# Patient Record
Sex: Female | Born: 1935
Health system: Southern US, Community
[De-identification: ages and names within clinical notes are randomized; demographics above are authoritative.]

## PROBLEM LIST (undated history)

## (undated) DIAGNOSIS — G931 Anoxic brain damage, not elsewhere classified: Secondary | ICD-10-CM

## (undated) DIAGNOSIS — I639 Cerebral infarction, unspecified: Secondary | ICD-10-CM

## (undated) DIAGNOSIS — I10 Essential (primary) hypertension: Secondary | ICD-10-CM

## (undated) DIAGNOSIS — R569 Unspecified convulsions: Secondary | ICD-10-CM

## (undated) HISTORY — PX: NO PAST SURGERIES: SHX2092

---

## 2006-02-25 ENCOUNTER — Inpatient Hospital Stay: Payer: Self-pay | Admitting: Internal Medicine

## 2006-02-25 ENCOUNTER — Other Ambulatory Visit: Payer: Self-pay

## 2011-04-30 ENCOUNTER — Emergency Department: Payer: Self-pay | Admitting: *Deleted

## 2013-04-21 ENCOUNTER — Emergency Department: Payer: Self-pay | Admitting: Emergency Medicine

## 2013-09-23 ENCOUNTER — Inpatient Hospital Stay: Payer: Self-pay | Admitting: Specialist

## 2013-09-23 LAB — COMPREHENSIVE METABOLIC PANEL
Albumin: 3.6 g/dL (ref 3.4–5.0)
Alkaline Phosphatase: 89 U/L
Anion Gap: 5 — ABNORMAL LOW (ref 7–16)
Calcium, Total: 9.2 mg/dL (ref 8.5–10.1)
Co2: 28 mmol/L (ref 21–32)
Creatinine: 1.14 mg/dL (ref 0.60–1.30)
EGFR (African American): 52 — ABNORMAL LOW
EGFR (Non-African Amer.): 44 — ABNORMAL LOW
Osmolality: 274 (ref 275–301)
Potassium: 4 mmol/L (ref 3.5–5.1)
Sodium: 136 mmol/L (ref 136–145)

## 2013-09-23 LAB — CK-MB
CK-MB: 13.6 ng/mL — ABNORMAL HIGH (ref 0.5–3.6)
CK-MB: 12.5 ng/mL — ABNORMAL HIGH (ref 0.5–3.6)

## 2013-09-23 LAB — CBC
HCT: 45.6 % (ref 35.0–47.0)
HGB: 15 g/dL (ref 12.0–16.0)
MCH: 28.5 pg (ref 26.0–34.0)
MCV: 87 fL (ref 80–100)
Platelet: 137 10*3/uL — ABNORMAL LOW (ref 150–440)
RBC: 5.27 10*6/uL — ABNORMAL HIGH (ref 3.80–5.20)
RDW: 14 % (ref 11.5–14.5)
WBC: 8.4 10*3/uL (ref 3.6–11.0)

## 2013-09-23 LAB — URINALYSIS, COMPLETE
Bacteria: NONE SEEN
Bilirubin,UR: NEGATIVE
Glucose,UR: NEGATIVE mg/dL (ref 0–75)
Leukocyte Esterase: NEGATIVE
Nitrite: NEGATIVE
Ph: 5 (ref 4.5–8.0)
Protein: 30
RBC,UR: 1 /HPF (ref 0–5)
Specific Gravity: 1.015 (ref 1.003–1.030)

## 2013-09-23 LAB — CK TOTAL AND CKMB (NOT AT ARMC)
CK, Total: 1626 U/L — ABNORMAL HIGH (ref 21–215)
CK-MB: 18.9 ng/mL — ABNORMAL HIGH (ref 0.5–3.6)

## 2013-09-23 LAB — TSH: Thyroid Stimulating Horm: 1.56 u[IU]/mL

## 2013-09-23 LAB — TROPONIN I: Troponin-I: 0.05 ng/mL

## 2013-09-23 LAB — AMMONIA: Ammonia, Plasma: 45 mcmol/L — ABNORMAL HIGH (ref 11–32)

## 2013-09-24 LAB — CBC WITH DIFFERENTIAL/PLATELET
Basophil #: 0 10*3/uL (ref 0.0–0.1)
Basophil %: 0.9 %
Eosinophil #: 0.2 10*3/uL (ref 0.0–0.7)
Eosinophil %: 3.4 %
HGB: 11.4 g/dL — ABNORMAL LOW (ref 12.0–16.0)
Lymphocyte %: 37.1 %
MCH: 27.3 pg (ref 26.0–34.0)
MCV: 86 fL (ref 80–100)
Monocyte #: 0.6 x10 3/mm (ref 0.2–0.9)
Platelet: 125 10*3/uL — ABNORMAL LOW (ref 150–440)
RBC: 4.18 10*6/uL (ref 3.80–5.20)
WBC: 4.4 10*3/uL (ref 3.6–11.0)

## 2013-09-24 LAB — BASIC METABOLIC PANEL
BUN: 8 mg/dL (ref 7–18)
Calcium, Total: 8.7 mg/dL (ref 8.5–10.1)
Chloride: 110 mmol/L — ABNORMAL HIGH (ref 98–107)
Creatinine: 0.99 mg/dL (ref 0.60–1.30)
EGFR (African American): >60
EGFR (Non-African Amer.): 53 — ABNORMAL LOW
Glucose: 84 mg/dL (ref 65–99)
Osmolality: 281 (ref 275–301)
Potassium: 3.9 mmol/L (ref 3.5–5.1)
Sodium: 142 mmol/L (ref 136–145)

## 2013-09-24 LAB — CK: CK, Total: 1160 U/L — ABNORMAL HIGH (ref 21–215)

## 2013-09-24 LAB — CK-MB: CK-MB: 8.6 ng/mL — ABNORMAL HIGH (ref 0.5–3.6)

## 2013-10-01 ENCOUNTER — Emergency Department: Payer: Self-pay | Admitting: Emergency Medicine

## 2013-10-01 LAB — COMPREHENSIVE METABOLIC PANEL
Alkaline Phosphatase: 93 U/L
Anion Gap: 3 — ABNORMAL LOW (ref 7–16)
Bilirubin,Total: 0.5 mg/dL (ref 0.2–1.0)
Chloride: 105 mmol/L (ref 98–107)
Co2: 25 mmol/L (ref 21–32)
Creatinine: 0.68 mg/dL (ref 0.60–1.30)
EGFR (African American): >60
Osmolality: 267 (ref 275–301)
Potassium: 5.8 mmol/L — ABNORMAL HIGH (ref 3.5–5.1)
SGOT(AST): 58 U/L — ABNORMAL HIGH (ref 15–37)
SGPT (ALT): 39 U/L (ref 12–78)
Total Protein: 7.6 g/dL (ref 6.4–8.2)

## 2013-10-01 LAB — BASIC METABOLIC PANEL
Anion Gap: 4 — ABNORMAL LOW (ref 7–16)
BUN: 9 mg/dL (ref 7–18)
Calcium, Total: 9.3 mg/dL (ref 8.5–10.1)
Creatinine: 0.74 mg/dL (ref 0.60–1.30)
Glucose: 116 mg/dL — ABNORMAL HIGH (ref 65–99)
Osmolality: 270 (ref 275–301)
Sodium: 135 mmol/L — ABNORMAL LOW (ref 136–145)

## 2013-10-01 LAB — URINALYSIS, COMPLETE
Bacteria: NONE SEEN
Bilirubin,UR: NEGATIVE
Glucose,UR: NEGATIVE mg/dL (ref 0–75)
Ketone: NEGATIVE
Leukocyte Esterase: NEGATIVE
RBC,UR: NONE SEEN /HPF (ref 0–5)
WBC UR: <1 /HPF (ref 0–5)

## 2013-10-01 LAB — CBC WITH DIFFERENTIAL/PLATELET
Basophil #: 0 10*3/uL (ref 0.0–0.1)
Basophil %: 0.8 %
Eosinophil %: 1.4 %
Lymphocyte %: 24.8 %
MCHC: 32.8 g/dL (ref 32.0–36.0)
MCV: 87 fL (ref 80–100)
Monocyte #: 0.7 x10 3/mm (ref 0.2–0.9)
Platelet: 180 10*3/uL (ref 150–440)
RBC: 4.51 10*6/uL (ref 3.80–5.20)
RDW: 13.9 % (ref 11.5–14.5)
WBC: 5.5 10*3/uL (ref 3.6–11.0)

## 2013-10-01 LAB — CK TOTAL AND CKMB (NOT AT ARMC)
CK, Total: 495 U/L — ABNORMAL HIGH (ref 21–215)
CK-MB: 2.5 ng/mL (ref 0.5–3.6)

## 2014-03-25 ENCOUNTER — Emergency Department: Payer: Self-pay | Admitting: Emergency Medicine

## 2014-03-25 LAB — COMPREHENSIVE METABOLIC PANEL
ALT: 28 U/L (ref 12–78)
Albumin: 3.7 g/dL (ref 3.4–5.0)
Alkaline Phosphatase: 88 U/L
Anion Gap: 0 — ABNORMAL LOW (ref 7–16)
BUN: 16 mg/dL (ref 7–18)
Bilirubin,Total: 1 mg/dL (ref 0.2–1.0)
CALCIUM: 9 mg/dL (ref 8.5–10.1)
CO2: 32 mmol/L (ref 21–32)
Chloride: 102 mmol/L (ref 98–107)
Creatinine: 0.77 mg/dL (ref 0.60–1.30)
EGFR (Non-African Amer.): >60
Glucose: 104 mg/dL — ABNORMAL HIGH (ref 65–99)
OSMOLALITY: 270 (ref 275–301)
POTASSIUM: 4.2 mmol/L (ref 3.5–5.1)
SGOT(AST): 22 U/L (ref 15–37)
Sodium: 134 mmol/L — ABNORMAL LOW (ref 136–145)
Total Protein: 8.1 g/dL (ref 6.4–8.2)

## 2014-03-25 LAB — CBC
HCT: 42.6 % (ref 35.0–47.0)
HGB: 13.5 g/dL (ref 12.0–16.0)
MCH: 27.6 pg (ref 26.0–34.0)
MCHC: 31.6 g/dL — ABNORMAL LOW (ref 32.0–36.0)
MCV: 87 fL (ref 80–100)
PLATELETS: 136 10*3/uL — AB (ref 150–440)
RBC: 4.87 10*6/uL (ref 3.80–5.20)
RDW: 14.2 % (ref 11.5–14.5)
WBC: 4.8 10*3/uL (ref 3.6–11.0)

## 2014-03-25 LAB — URINALYSIS, COMPLETE
Bilirubin,UR: NEGATIVE
Blood: NEGATIVE
GLUCOSE, UR: NEGATIVE mg/dL (ref 0–75)
KETONE: NEGATIVE
Leukocyte Esterase: NEGATIVE
Nitrite: NEGATIVE
PH: 7 (ref 4.5–8.0)
Protein: NEGATIVE
Specific Gravity: 1.01 (ref 1.003–1.030)
Squamous Epithelial: 1
WBC UR: NONE SEEN /HPF (ref 0–5)

## 2014-03-25 LAB — CK TOTAL AND CKMB (NOT AT ARMC)
CK, TOTAL: 568 U/L — AB
CK-MB: 3.2 ng/mL (ref 0.5–3.6)

## 2014-03-25 LAB — TROPONIN I: Troponin-I: <0.02 ng/mL

## 2014-04-16 ENCOUNTER — Emergency Department: Payer: Self-pay | Admitting: Emergency Medicine

## 2014-04-16 LAB — CBC
HCT: 39.2 % (ref 35.0–47.0)
HGB: 12.6 g/dL (ref 12.0–16.0)
MCH: 28.2 pg (ref 26.0–34.0)
MCHC: 32.2 g/dL (ref 32.0–36.0)
MCV: 88 fL (ref 80–100)
Platelet: 148 10*3/uL — ABNORMAL LOW (ref 150–440)
RBC: 4.47 10*6/uL (ref 3.80–5.20)
RDW: 13.8 % (ref 11.5–14.5)
WBC: 5.2 10*3/uL (ref 3.6–11.0)

## 2014-04-16 LAB — COMPREHENSIVE METABOLIC PANEL
ALT: 32 U/L (ref 12–78)
ANION GAP: 6 — AB (ref 7–16)
Albumin: 3.2 g/dL — ABNORMAL LOW (ref 3.4–5.0)
Alkaline Phosphatase: 88 U/L
BUN: 18 mg/dL (ref 7–18)
Bilirubin,Total: 0.5 mg/dL (ref 0.2–1.0)
CO2: 26 mmol/L (ref 21–32)
CREATININE: 1.18 mg/dL (ref 0.60–1.30)
Calcium, Total: 8.6 mg/dL (ref 8.5–10.1)
Chloride: 105 mmol/L (ref 98–107)
EGFR (African American): 49 — ABNORMAL LOW
GFR CALC NON AF AMER: 42 — AB
GLUCOSE: 99 mg/dL (ref 65–99)
Osmolality: 276 (ref 275–301)
Potassium: 4.4 mmol/L (ref 3.5–5.1)
SGOT(AST): 33 U/L (ref 15–37)
SODIUM: 137 mmol/L (ref 136–145)
Total Protein: 7.3 g/dL (ref 6.4–8.2)

## 2014-04-16 LAB — CK: CK, Total: 535 U/L — ABNORMAL HIGH

## 2015-01-27 NOTE — Consult Note (Signed)
Referring Physician:  Albertine Patricia   Primary Care Physician:   Albertine Patricia Avinger, [address], [place], [place] [address], [place] [phone]  Reason for Consult: Admit Date: 23-Sep-2013  Chief Complaint: Generalized weakness, confusion.  Reason for Consult: confusion; seizure   History of Present Illness: History of Present Illness:   81 year old woman with a rather complex medical history presents after being found down.  She fell at some point this morning and then pressed her life alert button.  She is not sure what caused the fall.  Not sure if she had a seizure she says.  "Felt weak" is her best description.  Sister at bedside reports the patient stopped taking all her medication a few weeks ago.  Says patient is generally noncompliant with her medication to start with.  Patient does not own a pill box.  She lives alone and gets most of her meals from [employer].  Given the patient has a vague history of seizures, it is unclear if her going down today may have been related to a seizure vs other cause of syncope. MEDICAL HISTORY: Anoxic brain injury. Acute respiratory failure. Dysphagia with PEG tube placement. Aspiration pneumonia.  Cardiac arrest subsequent to ventricular tachycardia and atrial flutter.  Bilateral pulmonary emboli with placement of IVC filter.  Retroperitoneal bleed secondary to heparin drip while in the hospital.  Seizure disorder.  Recurrent UTI. Hypertension.  Resolved stage II pressure ulcer on the right buttocks.  Depression.  Diet-controlled diabetes.  Azotemia. SURGICAL HISTORY:  Trach. PEG tube. HISTORY: at home by herself, gets meals from [employer] wheels.tobacco, drugs or alcohol. HISTORY:  MEDICATIONS: below. as below.     ROS:  General denies complaints   HEENT no complaints   Lungs no complaints   Cardiac no complaints   GI no complaints   GU no complaints   Musculoskeletal no complaints    Extremities no complaints   Skin no complaints   Endocrine no complaints   Psych no complaints   Past Medical/Surgical Hx:  Impaired Neurologic Function:   Anoxic Encephalopathy s/p cardiac arrest:   Seizures: Per family pt dx w/ seizures however non-compliant w/ meds  Hypertension:   Home Medications:  Medication Instructions Last Modified Date/Time  Keppra 750 mg oral tablet 1 tab(s) orally once a day 18-Dec-14 07:12  Tylenol  orally , As Needed 18-Dec-14 16:29  Keppra  orally 2 times a day 18-Dec-14 16:30  citalopram 20 mg oral tablet 1 tab(s) orally once a day 18-Dec-14 16:32  LORazepam  orally 2 times a day 18-Dec-14 16:30  aspirin  orally  18-Dec-14 16:31   [facility] Current Meds:   Sodium Chloride 0.9%, 1000 ml at 100 ml/hr, Stop After: 2 Doses  Acetaminophen * tablet, ( Tylenol (325 mg) tablet)  650 mg Oral q4h PRN for pain or temp. greater than 100.4  - Indication: Pain/Fever  Docusate Sodium capsule, ( Colace)  100 mg Oral bid PRN for constipation  - Indication: Stool Softener  HePARin injection, 5000 unit(s), Subcutaneous, q8h  Indication: Anticoagulant, Monitor Anticoags per hospital protocol  Ondansetron injection, ( Zofran injection )  4 mg, IV push, q4h PRN for Nausea/Vomiting  Indication: Nausea/ Vomiting  Senna tablet, ( Senokot)  1 tablet(s) Oral bid PRN for constipation  - Indication: Stool Softner/ Constipation/ Bowel Prep for Surgery  Instructions:  1 tablet = 8.6 mg  Lactulose 10G/78m Syrup, ( Cephulac 10G/111msyrup )  30 ml Oral bid  -Indication:Constipation/ Encephalopathy  Nursing Saline Flush, 3 to 6 ml, IV push, Q1M PRN for IV Maintenance  levETIRAcetam  tablet, 750 mg Oral q12h  - Indication: Seizures, bipolar disorder  Allergies:  No Known Allergies:   Vital Signs: **Vital Signs.:   18-Dec-14 08:24  Vital Signs Type Admission  Temperature Temperature (F) 98.5  Celsius 36.9  Temperature Source oral  Pulse Pulse 77   Respirations Respirations 20  Systolic BP Systolic BP 829  Diastolic BP (mmHg) Diastolic BP (mmHg) 80  Mean BP 100  Pulse Ox % Pulse Ox % 99  Pulse Ox Activity Level  At rest  Oxygen Delivery Room Air/ 21 %   EXAM: GENERAL: Pleasant woman, talkative.  NAD.  Normocephalic and atraumatic.  EYES: Funduscopic exam shows normal disc size, appearance and C/D ratio without clear evidence of papilledema.  CARDIOVASCULAR: S1 and S2 sounds are within normal limits, without murmurs, gallops, or rubs.  MUSCULOSKELETAL: Bulk - Normal Tone - Normal Pronator Drift - Absent bilaterally. Ambulation - Deferred due to falls risk.  R/L 5/5    Shoulder abduction (deltoid/supraspinatus, axillary/suprascapular n, C5) 5/5    Elbow flexion (biceps brachii, musculoskeletal n, C5-6) 5/5    Elbow extension (triceps, radial n, C7) 5/5    Finger adduction (interossei, ulnar n, T1)   5/5    Hip flexion (iliopsoas, L1/L2) 5/5    Knee flexion (hamstrings, sciatic n, L5/S1) 5/5    Knee extension (quadriceps, femoral n, L3/4) 5/5    Ankle dorsiflexion (tibialis anterior, deep fibular n, L4/5) 5/5    Ankle plantarflexion (gastroc, tibial n, S1)  NEUROLOGICAL: MENTAL STATUS: Patient is oriented to person, place and time.  Recent memory is mildly diminished.  Remote memory is intact.  Attention span and concentration are moderately diminished.  Naming, repetition, comprehension and expressive speech are within normal limits.  Patient's fund of knowledge is within normal limits for educational level.  CRANIAL NERVES: Normal    CN II (normal visual acuity and visual fields) Normal    CN III, IV, VI (extraocular muscles are intact) Normal    CN V (facial sensation is intact bilaterally) Normal    CN VII (facial strength is intact bilaterally) Normal    CN VIII (hearing is intact bilaterally) Normal    CN IX/X (palate elevates midline, normal phonation) Normal    CN XI (shoulder shrug strength is normal and  symmetric) Normal    CN XII (tongue protrudes midline)   SENSATION: Intact to pain and temp bilaterally (spinothalamic tracts) Intact to position and vibration bilaterally (dorsal columns)   REFLEXES: R/L 2+/2+    Biceps 2+/2+    Brachioradialis   2+/2+    Patellar 2+/2+    Achilles   COORDINATION/CEREBELLAR: Finger to nose testing is within normal limits..  Lab Results:  Thyroid:  18-Dec-14 06:06   Thyroid Stimulating Hormone 1.56 (0.45-4.50 (International Unit)  ----------------------- Pregnant patients have  different reference  ranges for TSH:  - - - - - - - - - -  Pregnant, first trimetser:  0.36 - 2.50 uIU/mL)  Hepatic:  18-Dec-14 04:24   Bilirubin, Total 1.0 (Result(s) reported on 23 Sep 2013 at 05:17AM.)  Alkaline Phosphatase 112 (45-117 NOTE: New Reference Range 08/27/13)  SGPT (ALT) 31  Total Protein, Serum  8.9    06:06   Bilirubin, Total 0.8  Alkaline Phosphatase 89 (45-117 NOTE: New Reference Range 08/27/13)  SGPT (ALT) 26  SGOT (AST)  47  Total Protein,  Serum 7.0  Albumin, Serum 3.6  Cardiology:  18-Dec-14 05:06   Ventricular Rate 75  Atrial Rate 75  P-R Interval 130  QRS Duration 82  QT 390  QTc 435  P Axis 64  R Axis 5  T Axis 16  ECG interpretation Normal sinus rhythm with sinus arrhythmia Possible Left atrial enlargement Septal infarct , age undetermined Abnormal ECG When compared with ECG of 30-Apr-2011 21:42, Vent. rate has decreased BY  81 BPM (RBBB and left posterior fascicular block) is no longer Present Septal infarct is now Present ----------unconfirmed---------- Confirmed by OVERREAD, NOT ([id number]), editor PEARSON, BARBARA ([id number]) on 09/23/2013 10:20:44 AM  Routine Chem:  18-Dec-14 04:24   Result Comment TROPONIN - RESULTS VERIFIED BY REPEAT TESTING.  - CALLED TO KIM KOECHERT AT 4287 ON  - 09/23/2013.Marland KitchenMarland KitchenTPL  - READ-BACK PROCESS PERFORMED.  Result(s) reported on 23 Sep 2013 at 05:14AM.  Glucose, Serum  104    06:06    Ammonia, Plasma  45 (Result(s) reported on 23 Sep 2013 at 06:41AM.)  Glucose, Serum  112  BUN 15  Creatinine (comp) 1.14  Sodium, Serum 136  Potassium, Serum 4.0  Chloride, Serum 103  CO2, Serum 28  Calcium (Total), Serum 9.2  Osmolality (calc) 274  eGFR (African American)  52  eGFR (Non-African American)  44 (eGFR values <20m/min/1.73 m2 may be an indication of chronic kidney disease (CKD). Calculated eGFR is useful in patients with stable renal function. The eGFR calculation will not be reliable in acutely ill patients when serum creatinine is changing rapidly. It is not useful in  patients on dialysis. The eGFR calculation may not be applicable to patients at the low and high extremes of body sizes, pregnant women, and vegetarians.)  Anion Gap  5    09:13   Result Comment TROPONIN - RESULTS VERIFIED BY REPEAT TESTING.  - READ-BACK PROCESS PERFORMED.  - GLENDA FIELDS 09/23/13 '@1002' ...MTV  Result(s) reported on 23 Sep 2013 at 10:04AM.    14:25   Result Comment TROPONIN - RESULTS VERIFIED BY REPEAT TESTING.  - READ-BACK PROCESS PERFORMED.  - ROBIN THOMAS 09/23/13 '@1513' ...MTV  Result(s) reported on 23 Sep 2013 at 03:17PM.  Cardiac:  18-Dec-14 04:24   CPK-MB, Serum  18.9 (Result(s) reported on 23 Sep 2013 at 05:14AM.)  Troponin I  0.07 (0.00-0.05 0.05 ng/mL or less: NEGATIVE  Repeat testing in 3-6 hrs  if clinically indicated. >0.05 ng/mL: POTENTIAL  MYOCARDIAL INJURY. Repeat  testing in 3-6 hrs if  clinically indicated. NOTE: An increase or decrease  of 30% or more on serial  testing suggests a  clinically important change)  CK, Total  1626    09:13   CPK-MB, Serum  13.6 (Result(s) reported on 23 Sep 2013 at 10:01AM.)  Troponin I  0.06 (0.00-0.05 0.05 ng/mL or less: NEGATIVE  Repeat testing in 3-6 hrs  if clinically indicated. >0.05 ng/mL: POTENTIAL  MYOCARDIAL INJURY. Repeat  testing in 3-6 hrs if  clinically indicated. NOTE: An increase or decrease  of 30%  or more on serial  testing suggests a  clinically important change)    14:25   CPK-MB, Serum  12.5 (Result(s) reported on 23 Sep 2013 at 03:10PM.)  Troponin I 0.05 (0.00-0.05 0.05 ng/mL or less: NEGATIVE  Repeat testing in 3-6 hrs  if clinically indicated. >0.05 ng/mL: POTENTIAL  MYOCARDIAL INJURY. Repeat  testing in 3-6 hrs if  clinically indicated. NOTE: An increase or decrease  of 30% or more on serial  testing suggests a  clinically important change)  Routine UA:  18-Dec-14 07:03   Color (UA) Yellow  Clarity (UA) Clear  Glucose (UA) Negative  Bilirubin (UA) Negative  Ketones (UA) Trace  Specific Gravity (UA) 1.015  Blood (UA) 2+  pH (UA) 5.0  Protein (UA) 30 mg/dL  Nitrite (UA) Negative  Leukocyte Esterase (UA) Negative (Result(s) reported on 23 Sep 2013 at 07:37AM.)  RBC (UA) 1 /HPF  WBC (UA) <1 /HPF  Bacteria (UA) NONE SEEN  Epithelial Cells (UA) <1 /HPF  Mucous (UA) PRESENT (Result(s) reported on 23 Sep 2013 at 07:37AM.)  Routine Hem:  18-Dec-14 04:24   WBC (CBC) 8.4  RBC (CBC)  5.27  Hemoglobin (CBC) 15.0  Hematocrit (CBC) 45.6  Platelet Count (CBC)  137 (Result(s) reported on 23 Sep 2013 at 04:50AM.)  MCV 87  MCH 28.5  MCHC 33.0  RDW 14.0   Radiology Results: CT:    18-Dec-14 03:34, CT Head Without Contrast  CT Head Without Contrast   REASON FOR EXAM:    ams  COMMENTS:       PROCEDURE: CT  - CT HEAD WITHOUT CONTRAST  - Sep 23 2013  3:34AM     CLINICAL DATA:  Altered mental status    EXAM:  CT HEAD WITHOUT CONTRAST    TECHNIQUE:  Contiguous axial images were obtained from the base of the skull  through the vertex without intravenous contrast.    COMPARISON:  Prior CT from 04/30/2011  FINDINGS:  Diffuse prominence of the CSF containing spaces is compatible with  generalized age-related atrophy. Scattered and confluent hypodensity  within the periventricular deep white matter is most consistent with  chronic small vessel ischemic changes.  Overall, these findings are  slightly progressed relative to prior CT from 2012, but stable as  compared with recent CT from 04/21/2013.    There is no acute intracranial hemorrhage or infarct. No mass lesion  or midline shift. Gray-white matter differentiation is well  maintained. Ventricles are normal in size without evidence of  hydrocephalus. No extra-axial fluid collection. Basal ganglia  calcifications noted.    The calvarium is intact.  Orbital soft tissues are within normal limits.    The paranasal sinuses and mastoid air cells are well pneumatized and  free of fluid.    Scalp soft tissues are unremarkable.     IMPRESSION:  1. No acute intracranial process.  2. Atrophy with chronic microvascular ischemic changes.      Electronically Signed    By: Jeannine Boga M.D.    On: 09/23/2013 03:50     Verified By: Neomia Glass, M.D.,   Impression/Recommendations: Recommendations:   81 year old woman with a rather complex medical history presents after being found down.   personally viewed, it is unremarkable.  Labs are unremarkable.  EKG shows NSR.  Unclear why the patient went down today.  In any case, the patient and her sister feel that she is at her baseline level both physically and cognitively.  Given ongoing concern for seizure, would rec routine either inpatient or outpatient EEG to eval for epileptiform activity.  Patient has stopped all of her medication and demonstrates poor cognitive processing today.  She would likely benefit from assisted living placement to help with her meals and medication administration.  Her memory and cognition should be evaluated in more detail on an outpatient basis.  Stroke unlikely in this case since no clear new neurologic deficits.  Rec patient take Keppra 500 mg bid or 750 mg  bid for seizure prevention given her history of seizures.   have reviewed the results of the most recent imaging studies, tests and labs as outlined  above and answered all related questions.  have personally viewed the patient's HCT and it is unremarkable. and coordinated plan of care with hospitalist.   Electronic Signatures: Anabel Bene (MD)  (Signed 19-Dec-14 00:04)  Authored: REFERRING PHYSICIAN, Primary Care Physician, Consult, History of Present Illness, Review of Systems, PAST MEDICAL/SURGICAL HISTORY, HOME MEDICATIONS, Current Medications, ALLERGIES, NURSING VITAL SIGNS, Physical Exam-, LAB RESULTS, RADIOLOGY RESULTS, Recommendations   Last Updated: 19-Dec-14 00:04 by Anabel Bene (MD)

## 2015-01-27 NOTE — H&P (Signed)
PATIENT NAME:  April Powers, TEMPLIN MR#:  [id number] DATE OF BIRTH:  21-May-1928  DATE OF ADMISSION:  09/23/2013  REFERRING PHYSICIAN: Marta Antu, MD  PRIMARY CARE PHYSICIAN: The patient reports she has not been seeing any primary care physician since she left [facility].   PRIMARY NEUROLOGIST: Jennings Books, MD   CHIEF COMPLAINT: Generalized weakness, confusion.   HISTORY OF PRESENT ILLNESS: This is an 81 year old female who lives at home by herself. The patient is known to have history of anoxic brain injury with seizure related to anoxic brain injury as well. The patient lives at home by herself. She used to live at [facility] for 1 year. The patient was living at home initially with home care, but due to financial reasons the patient is currently living by herself and she does not want any further assistance at home. The patient is an extremely poor historian, cannot give any reliable history, so most of the history was obtained from the sister at bedside. The patient is known to have cognitive impairment. The patient had basic work-up done and CT of the head. CT did not show any acute finding, only showed chronic microvascular ischemic changes. The patient's blood work was significant for elevated CK and CK-MB and mildly elevated troponins. Upon further questioning, the patient reports history of fall today. Reports she fell today at her home. She does not recall for how long she stayed on the floor, but she reports when she woke up she felt weak where she pressed her Life Alert, and she called 911 complaining of weakness. The patient is a poor historian, cannot give any further details on about the timing of these falls and how long did she stay on the floor. The patient denies any urinary incontinence, any tongue biting, denies any seizure activity when it happened, but at the same time we do not know as it was unwitnessed and there was no one else present at home. As well, the patient reports she  stopped taking all of his medication over the last 2 weeks as she thinks she does not need them. The patient reports she does not want to live in [place]. She wants to go to live with her son in [place]. Hospitalist service was requested to admit the patient for further evaluation.   PAST MEDICAL HISTORY: 1.  Anoxic brain injury. 2.  Acute respiratory failure resulting in temporary ventilator dependence with tracheostomy.  3.  Dysphagia with PEG tube placement. 4.  Aspiration pneumonia.  5.  Cardiac arrest subsequent to ventricular tachycardia and atrial flutter.  6.  Bilateral pulmonary emboli with placement of IVC filter.  7.  Retroperitoneal bleed secondary to heparin drip while in the hospital.  8.  Seizure disorder.  9.  Recurrent UTI. 10.  Hypertension.  11.  Resolved stage II pressure ulcer on the right buttocks.  12.  Depression.  13.  Diet-controlled diabetes.  14.  Azotemia.  PAST SURGICAL HISTORY:  1.  Trach. 2.  PEG tube placement.   HOME MEDICATIONS: As reported earlier, the patient does not take any home medication for the last few weeks. Did not bring her medication with her, but she was on Depakote 750 mg oral 2 times a day, but she has not been taking for a few weeks. Will request her medication list in the morning from the pharmacy. Daughter reports she goes to [facility].   SOCIAL HISTORY: The patient currently lives at home by herself. No tobacco. No alcohol. No  illicit drug use.   FAMILY HISTORY: Significant for hypertension.   REVIEW OF SYSTEMS: Even though the patient is alert x3, she is confused, very unreliable historian, cannot obtain reliable review of systems from her.  PHYSICAL EXAMINATION: VITAL SIGNS: Temperature 97.7, pulse 79, respiratory rate 26, blood pressure 131/72, saturating 100% on room air.  GENERAL: Well-nourished female who lies comfortable in bed, in no apparent distress.  HEENT: Head atraumatic, normocephalic. Pupils equal  and reactive to light. Pink conjunctivae. Anicteric sclerae. Dry oral mucosa.  NECK: Supple. No thyromegaly. No JVD.  CHEST: Good air entry bilaterally. No wheezing, rales, or rhonchi.  CARDIOVASCULAR: S1, S2 heard. No rubs, murmurs, or gallops.  ABDOMEN: Soft, nontender, and nondistended. Bowel sounds present.  EXTREMITIES: No edema. No clubbing. No cyanosis. Pedal pulses felt bilaterally.  PSYCHIATRIC: The patient is awake and alert x3, knows the month, knows the year.  It took her some time to figure the year. She is confused, has significantly impaired judgment, argumentative. NEUROLOGIC: Cranial nerves appear to be grossly intact. Motor: Appears to be moving all extremities without significant deficits, 5/5. MUSCULOSKELETAL: No joint erythema or effusion. Has significant arthritic changes in her fingers.   PERTINENT LABORATORY DATA: Glucose 112, BUN 15, creatinine 1.14, sodium 136, potassium 4, chloride 103, CO2 28, total protein 7, ALT 26, AST 47, alk phos 89, ammonia 45. CK total 1626, CK-MB 18.9, troponin 0.07. White blood cell 8.4, hemoglobin 15, hematocrit 45.6, platelets 137.   IMAGING STUDIES: CT head without contrast showing no acute intracranial process and atrophy with chronic microvascular ischemic changes.   Chest PA and lateral showing emphysema. No acute cardiopulmonary abnormality identified.   ASSESSMENT AND PLAN: 1.  Rhabdomyolysis. The patient appears to be in rhabdomyolysis. This is most likely as she has been on the floor for unknown amount of time so she will be started on IV fluids, 100 mL per hour, and will recheck CK total and CK-MB in a.m.  2.  Confusion, altered mental status. The patient appeared to be having baseline cognitive impairment. As per her sister, this is not much different from her baseline, but unclear if the initial episode was due to syncope or seizure episode, so the patient will be loaded with IV Keppra as the patient has not been taking for the last  2 weeks and will be resumed on Keppra. Will consult neurology, Dr. Jennings Books. As well, unclear if she had syncope or not so she will be monitored on off unit tele monitor as well. We will check TSH. We will check urinalysis. As well, her ammonia level is mildly elevated so we will start her on low-dose lactulose.  3.  Medication noncompliance. The patient was counseled at length and she is agreeable to resume back her Keppra.  4.  History of seizures. We will resume Keppra, start a loading dose. We will also consult Dr. Jennings Books.  5.  Deep vein thrombosis prophylaxis. Subcutaneous heparin.   CODE STATUS: The patient reports she is a FULL code. We will get social work consult as the patient appears to be needing more assistance at home and she might need placement.   TOTAL TIME SPENT ON ADMISSION AND PATIENT CARE: 55 minutes.   ____________________________ Albertine Patricia, MD dse:sb D: 09/23/2013 07:02:30 ET T: 09/23/2013 07:27:13 ET JOB#: [id number]  cc: Albertine Patricia, MD, <Dictator> Isamar Wellbrock Graciela Husbands MD ELECTRONICALLY SIGNED 09/24/2013 6:51

## 2015-01-28 NOTE — Discharge Summary (Signed)
PATIENT NAME:  April Powers, April MR#:  [id number] DATE OF BIRTH:  04/01/1928  DATE OF ADMISSION:  09/23/2013 DATE OF DISCHARGE:  09/25/2013  HISTORY OF PRESENT ILLNESS: For a detailed note, please take a look at the history and physical done on admission by Dr.  Randol KernElgergawy.    DIAGNOSES AT DISCHARGE: As follows: 1. Altered mental status secondary to seizures.  2. Seizures secondary to medical noncompliance.   DIET: The patient is being discharged on a regular diet.   ACTIVITY: As tolerated.   FOLLOWUP[place]   DISCHARGE MEDICATIONS: Keppra 750 mg b.i.d.   CONSULTANTS DURING THE HOSPITAL COURSE: Dr. Theora MasterZachary Potter from neurology.   PERTINENT STUDIES DONE DURING THE HOSPITAL COURSE: A CT scan of the head done without contrast showing no evidence of acute intracranial process, atrophy with chronic microvascular ischemic change. A chest x-ray done on admission showing emphysema, with no acute cardiopulmonary disease.   HOSPITAL COURSE: This is an 81 year old female, with medical problems as mentioned above, who presented to the hospital secondary to altered mental status and falls and also noted to be in mild acute rhabdomyolysis.   1. Altered mental status/confusion. Most likely cause of this was related to underlying seizures. The patient has a history of seizures, but was not compliant with her medications. She was admitted to the hospital, observed overnight. Had a CT scan of the head on admission which was negative. She had a metabolic and infectious workup, which was essentially normal, with a negative urinalysis and no other focal metabolic abnormality. The patient was restarted back on her Keppra. Has had no further seizure-type activity, and her mental status is now back to baseline. The patient was seen by neurology, who agreed with this management and thought that her mental status was likely related to underlying seizures rather than any other focal  abnormality.   2. Acute rhabdomyolysis. This was likely secondary to the seizures and the fall. The patient was hydrated with IV fluids, and her CKs had normalized upon discharge.  3. Medical noncompliance. The patient apparently was noncompliant with her antiepileptics. After her Keppra was resumed, she had no further seizure-type activity, and she was strongly advised to be adherent with her medications.   CODE STATUS: The patient is a full code.   DISPOSITION: She is being discharged home.   TIME SPENT: 35 minutes.   ____________________________ Rolly PancakeVivek J. Cherlynn KaiserSainani, MD vjs:lb D: 10/09/2013 08:26:00 ET T: 10/09/2013 08:48:38 ET JOB#: [id number]  cc: Rolly PancakeVivek J. Cherlynn KaiserSainani, MD, <Dictator> Houston SirenVIVEK J Jemar Paulsen MD ELECTRONICALLY SIGNED 10/20/2013 15:08

## 2016-10-29 ENCOUNTER — Ambulatory Visit: Payer: Self-pay | Admitting: Podiatry

## 2016-11-12 ENCOUNTER — Encounter: Payer: Self-pay | Admitting: Podiatry

## 2016-11-12 ENCOUNTER — Ambulatory Visit (INDEPENDENT_AMBULATORY_CARE_PROVIDER_SITE_OTHER): Payer: Medicare HMO | Admitting: Podiatry

## 2016-11-12 VITALS — BP 119/64 | HR 55 | Resp 16

## 2016-11-12 DIAGNOSIS — M79671 Pain in right foot: Secondary | ICD-10-CM | POA: Diagnosis not present

## 2016-11-12 DIAGNOSIS — B351 Tinea unguium: Secondary | ICD-10-CM

## 2016-11-12 DIAGNOSIS — L84 Corns and callosities: Secondary | ICD-10-CM | POA: Diagnosis not present

## 2016-11-12 DIAGNOSIS — L603 Nail dystrophy: Secondary | ICD-10-CM | POA: Diagnosis not present

## 2016-11-12 DIAGNOSIS — M79672 Pain in left foot: Secondary | ICD-10-CM | POA: Diagnosis not present

## 2016-11-12 DIAGNOSIS — M79609 Pain in unspecified limb: Secondary | ICD-10-CM | POA: Diagnosis not present

## 2016-11-12 DIAGNOSIS — L608 Other nail disorders: Secondary | ICD-10-CM

## 2016-11-12 DIAGNOSIS — L851 Acquired keratosis [keratoderma] palmaris et plantaris: Secondary | ICD-10-CM

## 2016-11-12 NOTE — Progress Notes (Signed)
   Subjective:    Patient ID: Gean QuintBetty Woods, female    DOB: 12/27/1927, 83 y.o.   MRN: [id number]  HPI    Review of Systems  All other systems reviewed and are negative.      Objective:   Physical Exam        Assessment & Plan:

## 2016-11-22 NOTE — Progress Notes (Signed)
Patient ID: April QuintBetty Powers, female   DOB: 12/27/1927, 83 y.o.   MRN: [id number]   SUBJECTIVE Patient  presents to office today complaining of elongated, thickened nails. Pain while ambulating in shoes. Patient is unable to trim their own nails.   OBJECTIVE General Patient is awake, alert, and oriented x 3 and in no acute distress. Derm hyperkeratotic pre-ulcerative callus lesions also noted to the second digits bilateral. Skin is dry and supple bilateral. Negative open lesions or macerations. Remaining integument unremarkable. Nails are tender, long, thickened and dystrophic with subungual debris, consistent with onychomycosis, 1-5 bilateral. No signs of infection noted. Vasc  DP and PT pedal pulses palpable bilaterally. Temperature gradient within normal limits.  Neuro Epicritic and protective threshold sensation diminished bilaterally.  Musculoskeletal Exam No symptomatic pedal deformities noted bilateral. Muscular strength within normal limits.  ASSESSMENT 1. Onychodystrophic nails 1-5 bilateral with hyperkeratosis of nails.  2. Onychomycosis of nail due to dermatophyte bilateral 3. Pain in foot bilateral 4. Pre-ulcerative callous lesions second digit bilateral feet  PLAN OF CARE 1. Patient evaluated today.  2. Instructed to maintain good pedal hygiene and foot care.  3. Mechanical debridement of nails 1-5 bilaterally performed using a nail nipper. Filed with dremel without incident.  4. Excisional debridement of painful callus lesions was performed using a chisel blade without incident. 5. Return to clinic when necessary   Felecia ShellingBrent M. Aldwin Micalizzi, DPM [facility]  Dr. Felecia ShellingBrent M. Mekaylah Klich, DPM    [address]                                        [place], [place] [address]                Office [phone]  Fax [phone]

## 2017-09-19 ENCOUNTER — Ambulatory Visit (INDEPENDENT_AMBULATORY_CARE_PROVIDER_SITE_OTHER): Payer: Medicare HMO | Admitting: Podiatry

## 2017-09-19 ENCOUNTER — Encounter: Payer: Self-pay | Admitting: Podiatry

## 2017-09-19 DIAGNOSIS — M79676 Pain in unspecified toe(s): Secondary | ICD-10-CM | POA: Diagnosis not present

## 2017-09-19 DIAGNOSIS — L989 Disorder of the skin and subcutaneous tissue, unspecified: Secondary | ICD-10-CM

## 2017-09-19 DIAGNOSIS — B351 Tinea unguium: Secondary | ICD-10-CM

## 2017-09-19 DIAGNOSIS — M79609 Pain in unspecified limb: Principal | ICD-10-CM

## 2017-09-22 NOTE — Progress Notes (Signed)
    Subjective: Patient is a 83 y.o. female presenting to the office today with a chief complaint of a painful callus lesion to the right second toe that has been present for several weeks.  Patient also complains of elongated, thickened nails that cause pain while ambulating in shoes. Patient is unable to trim their own nails. Patient presents today for further treatment and evaluation.  History reviewed. No pertinent past medical history.  Objective:  Physical Exam General: Alert and oriented x3 in no acute distress  Dermatology: Hyperkeratotic lesion present on the right second toe x1. Pain on palpation with a central nucleated core noted. Skin is warm, dry and supple bilateral lower extremities. Negative for open lesions or macerations. Nails are tender, long, thickened and dystrophic with subungual debris, consistent with onychomycosis, 1-5 bilateral. No signs of infection noted.  Vascular: Palpable pedal pulses bilaterally. No edema or erythema noted. Capillary refill within normal limits.  Neurological: Epicritic and protective threshold grossly intact bilaterally.   Musculoskeletal Exam: Pain on palpation at the keratotic lesion noted. Range of motion within normal limits bilateral. Muscle strength 5/5 in all groups bilateral.  Assessment: 1. Onychodystrophic nails 1-5 bilateral with hyperkeratosis of nails.  2. Onychomycosis of nail due to dermatophyte bilateral 3.  Pre-ulcerative callus to the right second toe x1   Plan of Care:  #1 Patient evaluated. #2 Excisional debridement of keratoic lesion using a chisel blade was performed without incident.  #3 Dressed with light dressing. #4 Mechanical debridement of nails 1-5 bilaterally performed using a nail nipper. Filed with dremel without incident.  #5 Patient is to return to the clinic as needed.   Felecia ShellingBrent M. Evans, DPM [facility]  Dr. Felecia ShellingBrent M. Evans, DPM    [address]                                         [place], [place] [address]                Office [phone]  Fax [phone]

## 2017-11-03 ENCOUNTER — Other Ambulatory Visit: Payer: Self-pay

## 2017-11-03 ENCOUNTER — Encounter: Payer: Self-pay | Admitting: Emergency Medicine

## 2017-11-03 ENCOUNTER — Emergency Department: Payer: Medicare HMO

## 2017-11-03 ENCOUNTER — Emergency Department
Admission: EM | Admit: 2017-11-03 | Discharge: 2017-11-04 | Disposition: A | Discharge status: Home / Self Care | Payer: Medicare HMO | Attending: Emergency Medicine | Admitting: Emergency Medicine

## 2017-11-03 DIAGNOSIS — R498 Other voice and resonance disorders: Secondary | ICD-10-CM | POA: Diagnosis not present

## 2017-11-03 DIAGNOSIS — J069 Acute upper respiratory infection, unspecified: Secondary | ICD-10-CM | POA: Diagnosis not present

## 2017-11-03 DIAGNOSIS — B349 Viral infection, unspecified: Secondary | ICD-10-CM | POA: Diagnosis not present

## 2017-11-03 DIAGNOSIS — Z79899 Other long term (current) drug therapy: Secondary | ICD-10-CM | POA: Insufficient documentation

## 2017-11-03 DIAGNOSIS — R05 Cough: Secondary | ICD-10-CM | POA: Diagnosis present

## 2017-11-03 DIAGNOSIS — I10 Essential (primary) hypertension: Secondary | ICD-10-CM | POA: Diagnosis not present

## 2017-11-03 DIAGNOSIS — R251 Tremor, unspecified: Secondary | ICD-10-CM

## 2017-11-03 DIAGNOSIS — B9789 Other viral agents as the cause of diseases classified elsewhere: Secondary | ICD-10-CM

## 2017-11-03 HISTORY — DX: Essential (primary) hypertension: I10

## 2017-11-03 HISTORY — DX: Anoxic brain damage, not elsewhere classified: G93.1

## 2017-11-03 HISTORY — DX: Cerebral infarction, unspecified: I63.9

## 2017-11-03 HISTORY — DX: Unspecified convulsions: R56.9

## 2017-11-03 LAB — URINALYSIS, COMPLETE (UACMP) WITH MICROSCOPIC
BACTERIA UA: NONE SEEN
Bilirubin Urine: NEGATIVE
Glucose, UA: NEGATIVE mg/dL
HGB URINE DIPSTICK: NEGATIVE
Ketones, ur: NEGATIVE mg/dL
Leukocytes, UA: NEGATIVE
NITRITE: NEGATIVE
PROTEIN: NEGATIVE mg/dL
Specific Gravity, Urine: 1.006 (ref 1.005–1.030)
pH: 6 (ref 5.0–8.0)

## 2017-11-03 MED ORDER — BENZONATATE 100 MG PO CAPS: 200.0000 mg | ORAL_CAPSULE | Freq: Three times a day (TID) | ORAL | 0 refills | Status: AC | PRN

## 2017-11-03 MED ORDER — HYDROXYZINE HCL 10 MG PO TABS
10.0000 mg | ORAL_TABLET | Freq: Once | ORAL | Status: AC
  Administered 2017-11-04: 10 mg via ORAL
  Filled 2017-11-03 (×2): qty 1

## 2017-11-03 NOTE — ED Provider Notes (Signed)
[facility] Emergency Department Provider Note  ____________________________________________  Time seen: Approximately 9:55 PM  I have reviewed the triage vital signs and the nursing notes.   HISTORY  Chief Complaint Cough   HPI April Powers is a 84 y.o. female who presents to the emergency department for evaluation and treatment of cough, bilateral hand tremor and voice tremor that seems to be worsening this evening.  Cough started a few days ago, but she has had an intermittent cough for the past couple of months.  No alleviating measures have been attempted for this complaint.  Past Medical History:  Diagnosis Date  . Anoxic brain injury (HCC)   . CVA (cerebral vascular accident) (HCC)   . Hypertension   . Seizures (HCC)     There are no active problems to display for this patient.   History reviewed. No pertinent surgical history.  Prior to Admission medications   Medication Sig Start Date End Date Taking? Authorizing Provider  levETIRAcetam (KEPPRA) 750 MG tablet Take 750 mg by mouth 2 (two) times daily.    [provider]  meloxicam (MOBIC) 7.5 MG tablet Take 7.5 mg by mouth daily.    [provider]  metoprolol tartrate (LOPRESSOR) 25 MG tablet Take 25 mg by mouth 2 (two) times daily.    [provider]  risperiDONE (RISPERDAL) 0.5 MG tablet Take 0.5 mg by mouth at bedtime.    [provider]    Allergies Patient has no known allergies.  No family history on file.  Social History Social History   Tobacco Use  . Smoking status: Never Smoker  . Smokeless tobacco: Never Used  Substance Use Topics  . Alcohol use: No  . Drug use: No    Review of Systems Constitutional: Negative for fever/chills ENT: Negative for sore throat. Cardiovascular: Denies chest pain. Respiratory: Negative for shortness of breath.  Positive for cough. Gastrointestinal: Negative for nausea, no no vomiting.  Negative for  diarrhea.  Musculoskeletal: Negative for body aches Skin: Negative for rash. Neurological: Negative for headaches.  Positive for increase in bilateral hand and vocal tremors. ____________________________________________   PHYSICAL EXAM:  VITAL SIGNS: ED Triage Vitals  Enc Vitals Group     BP 11/03/17 2052 (!) 160/80     Pulse Rate 11/03/17 2052 69     Resp 11/03/17 2052 20     Temp 11/03/17 2052 98.4 F (36.9 C)     Temp Source 11/03/17 2052 Oral     SpO2 11/03/17 2052 100 %     Weight 11/03/17 2053 140 lb (63.5 kg)     Height 11/03/17 2053 5\' 8"  (1.727 m)     Head Circumference --      Peak Flow --      Pain Score --      Pain Loc --      Pain Edu? --      Excl. in GC? --     Constitutional: Alert and oriented.  Well appearing and in no acute distress. Eyes: Conjunctivae are normal. EOMI. Nose: No congestion noted; no rhinnorhea. Mouth/Throat: Mucous membranes are moist.  Oropharynx clear. Tonsils not visualized. Neck: No stridor.  Lymphatic: No cervical lymphadenopathy. Cardiovascular: Normal rate, regular rhythm. Good peripheral circulation. Respiratory: Normal respiratory effort.  No retractions.  Breath sounds clear to auscultation throughout. Gastrointestinal: Soft and nontender.  Musculoskeletal: FROM x 4 extremities.  Neurologic:  Normal speech and language.  Focal tremor is appreciated.  Kinetic and postural tremors of bilateral  upper extremities are also noted.  Grip strength is equal.  Cranial nerves II through XII normal and intact as tested. Skin:  Skin is warm, dry and intact. No rash noted. Psychiatric: Mood and affect are normal. Speech and behavior are normal.  ____________________________________________   LABS (all labs ordered are listed, but only abnormal results are displayed)  Labs Reviewed - No data to display ____________________________________________  EKG  Not indicated ____________________________________________  RADIOLOGY  Chest  x-ray is negative for acute cardiopulmonary abnormality per radiology. ____________________________________________   PROCEDURES  Procedure(s) performed: None  Critical Care performed: No ____________________________________________   INITIAL IMPRESSION / ASSESSMENT AND PLAN / ED COURSE  84 year old female presenting to the emergency department for treatment and evaluation of increase in tremors and a nonproductive cough.  Patient feels that the tremors have become much worse today, however the family reports that this has been present for quite some time and does not appear to them to be any worse tonight than usual.  Chest x-ray and urinalysis are unremarkable.  10 mg of hydroxyzine will be given for the anxiety associated with the increase in tremors that the patient describes.  Cough will be treated with Tessalon Perles.  She will be instructed to follow-up with her primary care provider for additional explanation of the tremors as there has not been a diagnosis made in the past.  She was instructed to return to the emergency department for symptoms of change or worsen if she is unable to schedule an appointment.  Medications - No data to display  ED Discharge Orders    None      Pertinent labs & imaging results that were available during my care of the patient were reviewed by me and considered in my medical decision making (see chart for details).    If controlled substance prescribed during this visit, 12 month history viewed on the [facility] prior to issuing an initial prescription for Schedule II or III opiod. ____________________________________________   FINAL CLINICAL IMPRESSION(S) / ED DIAGNOSES  Final diagnoses:  None    Note:  This document was prepared using Dragon voice recognition software and may include unintentional dictation errors.     Chinita Pesterriplett, Camaron Cammack B, FNP 11/04/17 0009    Phineas SemenGoodman, [place], MD 11/04/17 [phone]

## 2017-11-03 NOTE — ED Triage Notes (Addendum)
Pt presents to ED family with c/o feeling like pt "hands have a tremor frequently" and her "voice was quivering" earlier this evening. Pt reports she also has an occasional non-productive cough "that we might need to check out" per her family. Pt currently alert and calm at this time with no distress noted. Pt currently denies any pain.

## 2017-11-04 ENCOUNTER — Other Ambulatory Visit: Payer: Self-pay

## 2017-11-04 NOTE — ED Notes (Signed)
Pt and family verbalize understanding of discharge instructions.

## 2018-02-24 ENCOUNTER — Encounter: Payer: Self-pay | Admitting: Podiatry

## 2018-02-24 ENCOUNTER — Ambulatory Visit: Payer: Medicare HMO | Admitting: Podiatry

## 2018-02-24 DIAGNOSIS — G5792 Unspecified mononeuropathy of left lower limb: Secondary | ICD-10-CM | POA: Diagnosis not present

## 2018-02-26 NOTE — Progress Notes (Signed)
   HPI: 84 year old female presenting today with a chief complaint of intermittent burning pain to the dorsum of the left foot that began several months ago. Standing and sitting for long periods of time increases the pain. She has not had any treatment for the symptoms. Patient is here for further evaluation and treatment.   Past Medical History:  Diagnosis Date  . Anoxic brain injury (HCC)   . CVA (cerebral vascular accident) (HCC)   . Hypertension   . Seizures [facility])      Physical Exam: General: The patient is alert and oriented x3 in no acute distress.  Dermatology: Skin is warm, dry and supple bilateral lower extremities. Negative for open lesions or macerations.  Vascular: Palpable pedal pulses bilaterally. No edema or erythema noted. Capillary refill within normal limits.  Neurological: Epicritic and protective threshold intact.  Paresthesia with burning, shooting pain noted to the left forefoot.   Musculoskeletal Exam: Range of motion within normal limits to all pedal and ankle joints bilateral. Muscle strength 5/5 in all groups bilateral.    Assessment: 1. Neuritis left forefoot   Plan of Care:  1. Patient evaluated.   2. Recommended good shoe gear.  3. Return to clinic as needed.       Felecia Shelling, DPM [facility]  Dr. Felecia Shelling, DPM    [address] [place], [place] [address]                Office [phone]  Fax [phone]

## 2018-05-15 ENCOUNTER — Telehealth: Payer: Self-pay | Admitting: Podiatry

## 2018-05-15 NOTE — Telephone Encounter (Signed)
This is [place] calling from [facility]. We referred April Powers to your office back in May. The referring provider, Meryl Crutchhelsea Boswell, NP would like those faxed to her at [phone]. Thank you. Bye bye.

## 2018-06-19 DIAGNOSIS — E785 Hyperlipidemia, unspecified: Secondary | ICD-10-CM | POA: Diagnosis not present

## 2018-06-19 DIAGNOSIS — G4089 Other seizures: Secondary | ICD-10-CM | POA: Diagnosis not present

## 2018-06-19 DIAGNOSIS — I10 Essential (primary) hypertension: Secondary | ICD-10-CM | POA: Diagnosis not present

## 2018-06-19 DIAGNOSIS — M1990 Unspecified osteoarthritis, unspecified site: Secondary | ICD-10-CM | POA: Diagnosis not present

## 2018-06-19 DIAGNOSIS — R5383 Other fatigue: Secondary | ICD-10-CM | POA: Diagnosis not present

## 2018-06-19 DIAGNOSIS — F0390 Unspecified dementia without behavioral disturbance: Secondary | ICD-10-CM | POA: Diagnosis not present

## 2018-06-19 DIAGNOSIS — G603 Idiopathic progressive neuropathy: Secondary | ICD-10-CM | POA: Diagnosis not present

## 2018-09-21 ENCOUNTER — Ambulatory Visit: Payer: Medicare HMO | Admitting: Podiatry

## 2018-09-21 ENCOUNTER — Encounter: Payer: Self-pay | Admitting: Podiatry

## 2018-09-21 DIAGNOSIS — B351 Tinea unguium: Secondary | ICD-10-CM | POA: Diagnosis not present

## 2018-09-21 DIAGNOSIS — M79609 Pain in unspecified limb: Secondary | ICD-10-CM

## 2018-09-21 NOTE — Progress Notes (Signed)
Complaint:  Visit Type: Patient returns to my office for continued preventative foot care services. Complaint: Patient states" my nails have grown long and thick and become painful to walk and wear shoes"  The patient presents for preventative foot care services. No changes to ROS  Podiatric Exam: Vascular: dorsalis pedis and posterior tibial pulses are palpable bilateral. Capillary return is immediate. Temperature gradient is WNL. Skin turgor WNL  Sensorium: Diminished  Semmes Weinstein monofilament test. Normal tactile sensation bilaterally. Nail Exam: Pt has thick disfigured discolored nails with subungual debris noted bilateral entire nail hallux through fifth toenails Ulcer Exam: There is no evidence of ulcer or pre-ulcerative changes or infection. Orthopedic Exam: Muscle tone and strength are WNL. No limitations in general ROM. No crepitus or effusions noted. Foot type and digits show no abnormalities. Bony prominences are unremarkable. Skin: No Porokeratosis. No infection or ulcers  Diagnosis:  Onychomycosis, , Pain in right toe, pain in left toes  Treatment & Plan Procedures and Treatment: Consent by patient was obtained for treatment procedures.   Debridement of mycotic and hypertrophic toenails, 1 through 5 bilateral and clearing of subungual debris. No ulceration, no infection noted.  Return Visit-Office Procedure: Patient instructed to return to the office for a follow up visit 3 months for continued evaluation and treatment.    Helane GuntherGregory Keyonia Gluth DPM

## 2018-09-25 DIAGNOSIS — G4089 Other seizures: Secondary | ICD-10-CM | POA: Diagnosis not present

## 2018-09-25 DIAGNOSIS — F329 Major depressive disorder, single episode, unspecified: Secondary | ICD-10-CM | POA: Diagnosis not present

## 2018-09-25 DIAGNOSIS — R5383 Other fatigue: Secondary | ICD-10-CM | POA: Diagnosis not present

## 2018-09-25 DIAGNOSIS — E785 Hyperlipidemia, unspecified: Secondary | ICD-10-CM | POA: Diagnosis not present

## 2018-09-25 DIAGNOSIS — I10 Essential (primary) hypertension: Secondary | ICD-10-CM | POA: Diagnosis not present

## 2018-09-25 DIAGNOSIS — F0390 Unspecified dementia without behavioral disturbance: Secondary | ICD-10-CM | POA: Diagnosis not present

## 2018-11-05 DIAGNOSIS — I10 Essential (primary) hypertension: Secondary | ICD-10-CM | POA: Diagnosis not present

## 2018-11-05 DIAGNOSIS — E785 Hyperlipidemia, unspecified: Secondary | ICD-10-CM | POA: Diagnosis not present

## 2018-11-05 DIAGNOSIS — R609 Edema, unspecified: Secondary | ICD-10-CM | POA: Diagnosis not present

## 2018-11-05 DIAGNOSIS — M79606 Pain in leg, unspecified: Secondary | ICD-10-CM | POA: Diagnosis not present

## 2018-11-16 DIAGNOSIS — F0390 Unspecified dementia without behavioral disturbance: Secondary | ICD-10-CM | POA: Diagnosis not present

## 2018-11-16 DIAGNOSIS — M1990 Unspecified osteoarthritis, unspecified site: Secondary | ICD-10-CM | POA: Diagnosis not present

## 2018-11-16 DIAGNOSIS — M79606 Pain in leg, unspecified: Secondary | ICD-10-CM | POA: Diagnosis not present

## 2018-11-16 DIAGNOSIS — E876 Hypokalemia: Secondary | ICD-10-CM | POA: Diagnosis not present

## 2018-11-16 DIAGNOSIS — R010 Benign and innocent cardiac murmurs: Secondary | ICD-10-CM | POA: Diagnosis not present

## 2018-11-16 DIAGNOSIS — R609 Edema, unspecified: Secondary | ICD-10-CM | POA: Diagnosis not present

## 2018-11-27 DIAGNOSIS — F0390 Unspecified dementia without behavioral disturbance: Secondary | ICD-10-CM | POA: Diagnosis not present

## 2018-11-27 DIAGNOSIS — R609 Edema, unspecified: Secondary | ICD-10-CM | POA: Diagnosis not present

## 2018-11-27 DIAGNOSIS — M79606 Pain in leg, unspecified: Secondary | ICD-10-CM | POA: Diagnosis not present

## 2018-11-27 DIAGNOSIS — I82512 Chronic embolism and thrombosis of left femoral vein: Secondary | ICD-10-CM | POA: Diagnosis not present

## 2018-11-27 DIAGNOSIS — I82511 Chronic embolism and thrombosis of right femoral vein: Secondary | ICD-10-CM | POA: Diagnosis not present

## 2018-12-02 DIAGNOSIS — R5383 Other fatigue: Secondary | ICD-10-CM | POA: Diagnosis not present

## 2018-12-02 DIAGNOSIS — E785 Hyperlipidemia, unspecified: Secondary | ICD-10-CM | POA: Diagnosis not present

## 2018-12-02 DIAGNOSIS — I10 Essential (primary) hypertension: Secondary | ICD-10-CM | POA: Diagnosis not present

## 2018-12-02 DIAGNOSIS — G4089 Other seizures: Secondary | ICD-10-CM | POA: Diagnosis not present

## 2018-12-07 DIAGNOSIS — G4089 Other seizures: Secondary | ICD-10-CM | POA: Diagnosis not present

## 2018-12-07 DIAGNOSIS — R609 Edema, unspecified: Secondary | ICD-10-CM | POA: Diagnosis not present

## 2018-12-07 DIAGNOSIS — F0390 Unspecified dementia without behavioral disturbance: Secondary | ICD-10-CM | POA: Diagnosis not present

## 2018-12-07 DIAGNOSIS — I82403 Acute embolism and thrombosis of unspecified deep veins of lower extremity, bilateral: Secondary | ICD-10-CM | POA: Diagnosis not present

## 2018-12-14 ENCOUNTER — Ambulatory Visit (INDEPENDENT_AMBULATORY_CARE_PROVIDER_SITE_OTHER): Payer: Medicare HMO | Admitting: Vascular Surgery

## 2018-12-14 ENCOUNTER — Encounter (INDEPENDENT_AMBULATORY_CARE_PROVIDER_SITE_OTHER): Payer: Self-pay | Admitting: Vascular Surgery

## 2018-12-14 DIAGNOSIS — Z79899 Other long term (current) drug therapy: Secondary | ICD-10-CM | POA: Diagnosis not present

## 2018-12-14 DIAGNOSIS — I82403 Acute embolism and thrombosis of unspecified deep veins of lower extremity, bilateral: Secondary | ICD-10-CM

## 2018-12-14 DIAGNOSIS — M159 Polyosteoarthritis, unspecified: Secondary | ICD-10-CM

## 2018-12-14 DIAGNOSIS — Z791 Long term (current) use of non-steroidal anti-inflammatories (NSAID): Secondary | ICD-10-CM

## 2018-12-14 DIAGNOSIS — R569 Unspecified convulsions: Secondary | ICD-10-CM | POA: Diagnosis not present

## 2018-12-14 DIAGNOSIS — I10 Essential (primary) hypertension: Secondary | ICD-10-CM | POA: Diagnosis not present

## 2018-12-14 DIAGNOSIS — M150 Primary generalized (osteo)arthritis: Secondary | ICD-10-CM | POA: Diagnosis not present

## 2018-12-14 NOTE — Progress Notes (Signed)
MRN : [id number]  April Powers is a 85 y.o. ([birth year]) female who presents with chief complaint of No chief complaint on file. Marland Kitchen  History of Present Illness:   The patient presents to the office for evaluation of DVT.  DVT was identified at [facility] by Duplex ultrasound.  The initial symptoms were pain and swelling in the lower extremity.  The patient notes the leg continues to be very painful with dependency and swells quite a bite.  Symptoms are much better with elevation.  The patient notes minimal edema in the morning which steadily worsens throughout the day.    The patient has not been using compression therapy at this point.  No SOB or pleuritic chest pains.  No cough or hemoptysis.  No blood per rectum or blood in any sputum.  No excessive bruising per the patient.   No outpatient medications have been marked as taking for the 12/14/18 encounter (Appointment) with Gilda Crease, Latina Craver, MD.    Past Medical History:  Diagnosis Date  . Anoxic brain injury (HCC)   . CVA (cerebral vascular accident) (HCC)   . Hypertension   . Seizures (HCC)     No past surgical history on file.  Social History Social History   Tobacco Use  . Smoking status: Never Smoker  . Smokeless tobacco: Never Used  Substance Use Topics  . Alcohol use: No  . Drug use: No    Family History No family history on file. No family history of bleeding/clotting disorders, porphyria or autoimmune disease   No Known Allergies   REVIEW OF SYSTEMS (Negative unless checked)  Constitutional: [] Weight loss  [] Fever  [] Chills Cardiac: [] Chest pain   [] Chest pressure   [] Palpitations   [] Shortness of breath when laying flat   [] Shortness of breath with exertion. Vascular:  [] Pain in legs with walking   [x] Pain in legs with standing  [x] History of DVT   [] Phlebitis   [x] Swelling in legs   [] Varicose veins   [] Non-healing ulcers Pulmonary:   [] Uses home oxygen   [] Productive cough   [] Hemoptysis   [] Wheeze   [] COPD   [] Asthma Neurologic:  [] Dizziness   [x] Seizures   [] History of stroke   [] History of TIA  [] Aphasia   [] Vissual changes   [] Weakness or numbness in arm   [] Weakness or numbness in leg Musculoskeletal:   [] Joint swelling   [x] Joint pain   [x] Low back pain Hematologic:  [] Easy bruising  [] Easy bleeding   [] Hypercoagulable state   [] Anemic Gastrointestinal:  [] Diarrhea   [] Vomiting  [] Gastroesophageal reflux/heartburn   [] Difficulty swallowing. Genitourinary:  [] Chronic kidney disease   [] Difficult urination  [] Frequent urination   [] Blood in urine Skin:  [] Rashes   [] Ulcers  Psychological:  [] History of anxiety   []  History of major depression.  Physical Examination  There were no vitals filed for this visit. There is no height or weight on file to calculate BMI. Gen: WD/WN, NAD Head: [place]/AT, No temporalis wasting.  Ear/Nose/Throat: Hearing grossly intact, nares w/o erythema or drainage, poor dentition Eyes: PER, EOMI, sclera nonicteric.  Neck: Supple, no masses.  No bruit or JVD.  Pulmonary:  Good air movement, clear to auscultation bilaterally, no use of accessory muscles.  Cardiac: RRR, normal S1, S2, no Murmurs. Vascular: scattered varicosities present bilaterally.  Mild venous stasis changes to the legs bilaterally.  3+ soft pitting edema Vessel Right Left  Radial Palpable Palpable  PT Palpable Palpable  DP Palpable Palpable  Gastrointestinal: soft, non-distended. No guarding/no  peritoneal signs.  Musculoskeletal: M/S 5/5 throughout.  Multiple degenerative joint deformity.  Neurologic: CN 2-12 intact. Pain and light touch intact in extremities.  Symmetrical.  Speech is fluent. Motor exam as listed above. Psychiatric: Judgment intact, Mood & affect appropriate for pt's clinical situation. Dermatologic: mild venous rashes no ulcers noted.  No changes consistent with cellulitis. Lymph : No Cervical lymphadenopathy, no lichenification or skin changes of chronic  lymphedema.  CBC Lab Results  Component Value Date   WBC 5.2 04/16/2014   HGB 12.6 04/16/2014   HCT 39.2 04/16/2014   MCV 88 04/16/2014   PLT 148 (L) 04/16/2014    BMET    Component Value Date/Time   NA 137 04/16/2014 0209   K 4.4 04/16/2014 0209   CL 105 04/16/2014 0209   CO2 26 04/16/2014 0209   GLUCOSE 99 04/16/2014 0209   BUN 18 04/16/2014 0209   CREATININE 1.18 04/16/2014 0209   CALCIUM 8.6 04/16/2014 0209   GFRNONAA 42 (L) 04/16/2014 0209   GFRAA 49 (L) 04/16/2014 0209   CrCl cannot be calculated (Patient's most recent lab result is older than the maximum 21 days allowed.).  COAG No results found for: INR, PROTIME  Radiology No results found.   Assessment/Plan 1. Acute deep vein thrombosis (DVT) of both lower extremities, unspecified vein (HCC) Recommend:   No surgery or intervention at this point in time.  IVC filter is not indicated at present.  Patient's duplex ultrasound of the venous system shows DVT from the popliteal to the femoral veins.  The patient is initiated on anticoagulation   Elevation was stressed, use of a recliner was discussed.  I have had a long discussion with the patient regarding DVT and post phlebitic changes such as swelling and why it  causes symptoms such as pain.  The patient will wear graduated compression stockings class 1 (20-30 mmHg), beginning after three full days of anticoagulation, on a daily basis a prescription was given. The patient will  beginning wearing the stockings first thing in the morning and removing them in the evening. The patient is instructed specifically not to sleep in the stockings.  In addition, behavioral modification including elevation during the day and avoidance of prolonged dependency will be initiated.    The patient will continue anticoagulation for now as there have not been any problems or complications at this point.   - VAS [place] LOWER EXTREMITY VENOUS (DVT); Future  2. Primary osteoarthritis  involving multiple joints Continue NSAID medications as already ordered, these medications have been reviewed and there are no changes at this time.  Continued activity and therapy was stressed.   3. Essential hypertension Continue antihypertensive medications as already ordered, these medications have been reviewed and there are no changes at this time.   4. Seizure (HCC) Continue Keppra as already ordered, these medications have been reviewed and there are no changes at this time.     Levora Dredge, MD  12/14/2018 1:13 PM

## 2018-12-20 ENCOUNTER — Encounter (INDEPENDENT_AMBULATORY_CARE_PROVIDER_SITE_OTHER): Payer: Self-pay | Admitting: Vascular Surgery

## 2018-12-20 DIAGNOSIS — I82409 Acute embolism and thrombosis of unspecified deep veins of unspecified lower extremity: Secondary | ICD-10-CM | POA: Insufficient documentation

## 2018-12-20 DIAGNOSIS — M1990 Unspecified osteoarthritis, unspecified site: Secondary | ICD-10-CM | POA: Insufficient documentation

## 2018-12-20 DIAGNOSIS — I10 Essential (primary) hypertension: Secondary | ICD-10-CM | POA: Insufficient documentation

## 2018-12-20 DIAGNOSIS — R569 Unspecified convulsions: Secondary | ICD-10-CM | POA: Insufficient documentation

## 2018-12-21 ENCOUNTER — Ambulatory Visit: Payer: Medicare HMO | Admitting: Podiatry

## 2018-12-21 ENCOUNTER — Other Ambulatory Visit: Payer: Self-pay

## 2018-12-21 ENCOUNTER — Encounter: Payer: Self-pay | Admitting: Podiatry

## 2018-12-21 DIAGNOSIS — M79676 Pain in unspecified toe(s): Secondary | ICD-10-CM | POA: Diagnosis not present

## 2018-12-21 DIAGNOSIS — B351 Tinea unguium: Secondary | ICD-10-CM | POA: Diagnosis not present

## 2018-12-21 DIAGNOSIS — D689 Coagulation defect, unspecified: Secondary | ICD-10-CM

## 2018-12-21 DIAGNOSIS — M79609 Pain in unspecified limb: Principal | ICD-10-CM

## 2018-12-21 NOTE — Progress Notes (Signed)
Complaint:  Visit Type: Patient returns to my office for continued preventative foot care services. Complaint: Patient states" my nails have grown long and thick and become painful to walk and wear shoes"  The patient presents for preventative foot care services. No changes to ROS.  Patient is taking eliquiss.  Podiatric Exam: Vascular: dorsalis pedis and posterior tibial pulses are palpable bilateral. Capillary return is immediate. Temperature gradient is WNL. Skin turgor WNL  Sensorium: Diminished  Semmes Weinstein monofilament test. Normal tactile sensation bilaterally. Nail Exam: Pt has thick disfigured discolored nails with subungual debris noted bilateral entire nail hallux through fifth toenails Ulcer Exam: There is no evidence of ulcer or pre-ulcerative changes or infection. Orthopedic Exam: Muscle tone and strength are WNL. No limitations in general ROM. No crepitus or effusions noted. Foot type and digits show no abnormalities. Bony prominences are unremarkable. Skin: No Porokeratosis. No infection or ulcers  Diagnosis:  Onychomycosis, , Pain in right toe, pain in left toes  Treatment & Plan Procedures and Treatment: Consent by patient was obtained for treatment procedures.   Debridement of mycotic and hypertrophic toenails, 1 through 5 bilateral and clearing of subungual debris. No ulceration, no infection noted.  Return Visit-Office Procedure: Patient instructed to return to the office for a follow up visit 3 months for continued evaluation and treatment.    Emonni Depasquale DPM 

## 2018-12-25 DIAGNOSIS — E559 Vitamin D deficiency, unspecified: Secondary | ICD-10-CM | POA: Diagnosis not present

## 2018-12-25 DIAGNOSIS — G4089 Other seizures: Secondary | ICD-10-CM | POA: Diagnosis not present

## 2018-12-25 DIAGNOSIS — R5383 Other fatigue: Secondary | ICD-10-CM | POA: Diagnosis not present

## 2018-12-25 DIAGNOSIS — I10 Essential (primary) hypertension: Secondary | ICD-10-CM | POA: Diagnosis not present

## 2018-12-25 DIAGNOSIS — F0390 Unspecified dementia without behavioral disturbance: Secondary | ICD-10-CM | POA: Diagnosis not present

## 2018-12-25 DIAGNOSIS — I82403 Acute embolism and thrombosis of unspecified deep veins of lower extremity, bilateral: Secondary | ICD-10-CM | POA: Diagnosis not present

## 2018-12-25 DIAGNOSIS — E785 Hyperlipidemia, unspecified: Secondary | ICD-10-CM | POA: Diagnosis not present

## 2019-01-28 ENCOUNTER — Encounter (INDEPENDENT_AMBULATORY_CARE_PROVIDER_SITE_OTHER): Payer: Self-pay

## 2019-03-15 ENCOUNTER — Ambulatory Visit: Payer: Medicare HMO | Admitting: Podiatry

## 2019-03-22 ENCOUNTER — Ambulatory Visit: Payer: Medicare HMO | Admitting: Podiatry

## 2019-04-01 ENCOUNTER — Ambulatory Visit: Payer: Medicare HMO | Admitting: Podiatry

## 2019-04-01 ENCOUNTER — Other Ambulatory Visit: Payer: Self-pay

## 2019-04-01 ENCOUNTER — Encounter: Payer: Self-pay | Admitting: Podiatry

## 2019-04-01 DIAGNOSIS — M79674 Pain in right toe(s): Secondary | ICD-10-CM | POA: Diagnosis not present

## 2019-04-01 DIAGNOSIS — M79675 Pain in left toe(s): Secondary | ICD-10-CM

## 2019-04-01 DIAGNOSIS — D689 Coagulation defect, unspecified: Secondary | ICD-10-CM

## 2019-04-01 DIAGNOSIS — B351 Tinea unguium: Secondary | ICD-10-CM | POA: Insufficient documentation

## 2019-04-01 NOTE — Progress Notes (Signed)
Complaint:  Visit Type: Patient returns to my office for continued preventative foot care services. Complaint: Patient states" my nails have grown long and thick and become painful to walk and wear shoes"  The patient presents for preventative foot care services. No changes to ROS.  Patient is taking eliquiss.  Podiatric Exam: Vascular: dorsalis pedis and posterior tibial pulses are palpable bilateral. Capillary return is immediate. Temperature gradient is WNL. Skin turgor WNL  Sensorium: Diminished  Semmes Weinstein monofilament test. Normal tactile sensation bilaterally. Nail Exam: Pt has thick disfigured discolored nails with subungual debris noted bilateral entire nail hallux through fifth toenails Ulcer Exam: There is no evidence of ulcer or pre-ulcerative changes or infection. Orthopedic Exam: Muscle tone and strength are WNL. No limitations in general ROM. No crepitus or effusions noted. Foot type and digits show no abnormalities. Bony prominences are unremarkable. Skin: No Porokeratosis. No infection or ulcers  Diagnosis:  Onychomycosis, , Pain in right toe, pain in left toes  Treatment & Plan Procedures and Treatment: Consent by patient was obtained for treatment procedures.   Debridement of mycotic and hypertrophic toenails, 1 through 5 bilateral and clearing of subungual debris. No ulceration, no infection noted.  Return Visit-Office Procedure: Patient instructed to return to the office for a follow up visit 3 months for continued evaluation and treatment.    Gardiner Barefoot DPM

## 2019-06-17 ENCOUNTER — Ambulatory Visit (INDEPENDENT_AMBULATORY_CARE_PROVIDER_SITE_OTHER): Payer: Medicare HMO | Admitting: Nurse Practitioner

## 2019-06-17 ENCOUNTER — Encounter (INDEPENDENT_AMBULATORY_CARE_PROVIDER_SITE_OTHER): Payer: Self-pay

## 2019-06-17 ENCOUNTER — Ambulatory Visit (INDEPENDENT_AMBULATORY_CARE_PROVIDER_SITE_OTHER): Payer: Medicare HMO

## 2019-06-17 ENCOUNTER — Encounter (INDEPENDENT_AMBULATORY_CARE_PROVIDER_SITE_OTHER): Payer: Self-pay | Admitting: Nurse Practitioner

## 2019-06-17 ENCOUNTER — Other Ambulatory Visit: Payer: Self-pay

## 2019-06-17 VITALS — BP 127/79 | HR 101 | Resp 14 | Ht 68.0 in | Wt 140.0 lb

## 2019-06-17 DIAGNOSIS — I10 Essential (primary) hypertension: Secondary | ICD-10-CM

## 2019-06-17 DIAGNOSIS — I82403 Acute embolism and thrombosis of unspecified deep veins of lower extremity, bilateral: Secondary | ICD-10-CM

## 2019-06-17 DIAGNOSIS — M159 Polyosteoarthritis, unspecified: Secondary | ICD-10-CM

## 2019-06-17 DIAGNOSIS — M150 Primary generalized (osteo)arthritis: Secondary | ICD-10-CM | POA: Diagnosis not present

## 2019-06-18 ENCOUNTER — Encounter (INDEPENDENT_AMBULATORY_CARE_PROVIDER_SITE_OTHER): Payer: Self-pay | Admitting: Nurse Practitioner

## 2019-06-18 NOTE — Progress Notes (Signed)
SUBJECTIVE:  Patient ID: April Powers, female    DOB: Nov 27, 1927, 85 y.o.   MRN: [id number] Chief Complaint  Patient presents with  . Follow-up    HPI  April Powers is a 85 y.o. female that returns following acute DVTs bilaterally.  Currently the patient's daughter is present and the patient is a poor historian.  She only provides small amounts of information.  The daughter states that her biggest issue at the moment is with swelling of her lower extremities.  She also states that sometimes she has aches and pains.  She is also concerned due to the fact that her mother does not ambulate regularly.  She denies wearing compression stockings on a daily basis.  She denies any falls.  She denies any incidence of bleeding such as GI bleeds or nosebleeds.  She denies any issues with keeping up with her current medications including Eliquis.  She denies any fever, chills, nausea, vomiting or diarrhea.  Noninvasive studies today show findings consistent with a chronic DVT in the right common femoral vein as well as the right femoral vein.  These findings are actually improved from previous examination.  The left lower extremity also has a chronic DVT in the left femoral vein.  The results are also improved from previous examination.  It is also worth noting that the patient was very rigid and stiff during the examination and was also unable to separate her legs.  This caused some issues with the examination so therefore it was somewhat limited.  Past Medical History:  Diagnosis Date  . Anoxic brain injury (HCC)   . CVA (cerebral vascular accident) (HCC)   . Hypertension   . Seizures (HCC)     Past Surgical History:  Procedure Laterality Date  . NO PAST SURGERIES      Social History   Socioeconomic History  . Marital status: Widowed    Spouse name: Not on file  . Number of children: Not on file  . Years of education: Not on file  . Highest education level: Not on file  Occupational  History  . Not on file  Social Needs  . Financial resource strain: Not on file  . Food insecurity    Worry: Not on file    Inability: Not on file  . Transportation needs    Medical: Not on file    Non-medical: Not on file  Tobacco Use  . Smoking status: Never Smoker  . Smokeless tobacco: Never Used  Substance and Sexual Activity  . Alcohol use: No  . Drug use: No  . Sexual activity: Not on file  Lifestyle  . Physical activity    Days per week: Not on file    Minutes per session: Not on file  . Stress: Not on file  Relationships  . Social Musician on phone: Not on file    Gets together: Not on file    Attends religious service: Not on file    Active member of club or organization: Not on file    Attends meetings of clubs or organizations: Not on file    Relationship status: Not on file  . Intimate partner violence    Fear of current or ex partner: Not on file    Emotionally abused: Not on file    Physically abused: Not on file    Forced sexual activity: Not on file  Other Topics Concern  . Not on file  Social History Narrative  . Not  on file    Family History  Problem Relation Age of Onset  . Heart attack Mother   . Hypertension Sister   . Cancer Brother   . Diabetes Brother     No Known Allergies   Review of Systems   Review of Systems: Negative Unless Checked Constitutional: [] Weight loss  [] Fever  [] Chills Cardiac: [] Chest pain   []  Atrial Fibrillation  [] Palpitations   [] Shortness of breath when laying flat   [] Shortness of breath with exertion. [] Shortness of breath at rest Vascular:  [] Pain in legs with walking   [] Pain in legs with standing [] Pain in legs when laying flat   [] Claudication    [] Pain in feet when laying flat    [x] History of DVT   [] Phlebitis   [x] Swelling in legs   [] Varicose veins   [] Non-healing ulcers Pulmonary:   [] Uses home oxygen   [] Productive cough   [] Hemoptysis   [] Wheeze  [] COPD   [] Asthma Neurologic:  [] Dizziness    [x] Seizures  [] Blackouts [x] History of stroke   [] History of TIA  [] Aphasia   [] Temporary Blindness   [] Weakness or numbness in arm   [x] Weakness or numbness in leg Musculoskeletal:   [] Joint swelling   [] Joint pain   [] Low back pain  []  History of Knee Replacement [x] Arthritis [] back Surgeries  []  Spinal Stenosis    Hematologic:  [] Easy bruising  [] Easy bleeding   [] Hypercoagulable state   [] Anemic Gastrointestinal:  [] Diarrhea   [] Vomiting  [] Gastroesophageal reflux/heartburn   [] Difficulty swallowing. [] Abdominal pain Genitourinary:  [] Chronic kidney disease   [] Difficult urination  [] Anuric   [] Blood in urine [] Frequent urination  [] Burning with urination   [] Hematuria Skin:  [] Rashes   [] Ulcers [] Wounds Psychological:  [] History of anxiety   []  History of major depression  [x]  Memory Difficulties      OBJECTIVE:   Physical Exam  BP 127/79 (BP Location: Left Arm, Patient Position: Sitting, Cuff Size: Normal)   Pulse (!) 101   Resp 14   Ht 5\' 8"  (1.727 m)   Wt 140 lb (63.5 kg)   BMI 21.29 kg/m   Gen: WD/WN, NAD Head: [place]/AT, No temporalis wasting.  Ear/Nose/Throat: Hearing grossly intact, nares w/o erythema or drainage Eyes: PER, EOMI, sclera nonicteric.  Neck: Supple, no masses.  No JVD.  Pulmonary:  Good air movement, no use of accessory muscles.  Cardiac: RRR Vascular:  2+ edema bilaterally Vessel Right Left  Radial Palpable Palpable  Dorsalis Pedis Palpable Palpable  Posterior Tibial Palpable Palpable   Gastrointestinal: soft, non-distended. No guarding/no peritoneal signs.  Musculoskeletal: Ambulates with a cane. No deformity or atrophy.  Neurologic: Pain and light touch intact in extremities.  Symmetrical.  Speech is fluent. Motor exam as listed above. Psychiatric: Judgment intact, Mood & affect appropriate for pt's clinical situation. Dermatologic: No Venous rashes. No Ulcers Noted.  No changes consistent with cellulitis. Lymph : No Cervical lymphadenopathy, no  lichenification or skin changes of chronic lymphedema.       ASSESSMENT AND PLAN:  1. Acute deep vein thrombosis (DVT) of both lower extremities, unspecified vein (HCC) Currently at this time the patient denies any issues with continuing her Eliquis.  Due to the fact that the patient still has extensive amount of chronic thrombus we will maintain her Eliquis at this time.  I have advised the patient and daughter that she should utilize medical grade 1 compression stockings to help with edema.  We also discussed the normal pathophysiological changes of postphlebitic syndrome and that in  some instances edema as well as pain are common.  The patient should also elevate her lower extremities as much as possible.  I spoke with the patient about continuing ambulating.  The daughter was concerned about her use of a cane however given her advanced age and her mental status of the cane allows her to be more steady and it is a good option.  We will have the patient return in 6 months with repeat noninvasive studies to assess the progress of the patient swelling as well as the progress of her bilateral lower extremity DVTs.  2. Essential hypertension Continue antihypertensive medications as already ordered, these medications have been reviewed and there are no changes at this time.   3. Primary osteoarthritis involving multiple joints Continue NSAID medications as already ordered, these medications have been reviewed and there are no changes at this time.  Continued activity and therapy was stressed.    Current Outpatient Medications on File Prior to Visit  Medication Sig Dispense Refill  . acetaminophen (TYLENOL) 500 MG tablet Take 500 mg by mouth every 6 (six) hours as needed.    Marland Kitchen apixaban (ELIQUIS) 2.5 MG TABS tablet Take 2.5 mg by mouth 2 (two) times daily.    . diclofenac sodium (VOLTAREN) 1 % GEL USING DOSING CARD APPLY 2 GRAMS TO BILATERAL KNEES DAILY STOP MELOXICAM    . donepezil (ARICEPT)  10 MG tablet Take 10 mg by mouth daily.    . furosemide (LASIX) 40 MG tablet     . levETIRAcetam (KEPPRA) 750 MG tablet Take 750 mg by mouth 2 (two) times daily.    . metoprolol tartrate (LOPRESSOR) 25 MG tablet Take 25 mg by mouth 2 (two) times daily.    . potassium chloride (K-DUR,KLOR-CON) 10 MEQ tablet TAKE 1 TABLET BY MOUTH IN THE MORNING WITH FUROSEMIDE    . risperiDONE (RISPERDAL) 0.5 MG tablet Take 0.5 mg by mouth at bedtime.    . Vitamin D, Ergocalciferol, (DRISDOL) 1.25 MG (50000 UT) CAPS capsule      No current facility-administered medications on file prior to visit.     There are no Patient Instructions on file for this visit. No follow-ups on file.   Kris Hartmann, NP  This note was completed with Sales executive.  Any errors are purely unintentional.

## 2019-07-01 ENCOUNTER — Encounter: Payer: Self-pay | Admitting: Podiatry

## 2019-07-01 ENCOUNTER — Other Ambulatory Visit: Payer: Self-pay

## 2019-07-01 ENCOUNTER — Ambulatory Visit: Payer: Medicare HMO | Admitting: Podiatry

## 2019-07-01 DIAGNOSIS — M79674 Pain in right toe(s): Secondary | ICD-10-CM

## 2019-07-01 DIAGNOSIS — M79675 Pain in left toe(s): Secondary | ICD-10-CM | POA: Diagnosis not present

## 2019-07-01 DIAGNOSIS — S81802A Unspecified open wound, left lower leg, initial encounter: Secondary | ICD-10-CM

## 2019-07-01 DIAGNOSIS — B351 Tinea unguium: Secondary | ICD-10-CM | POA: Diagnosis not present

## 2019-07-01 DIAGNOSIS — D689 Coagulation defect, unspecified: Secondary | ICD-10-CM

## 2019-07-01 NOTE — Progress Notes (Signed)
Complaint:  Visit Type: Patient returns to my office for continued preventative foot care services. Complaint: Patient states" my nails have grown long and thick and become painful to walk and wear shoes"  The patient presents for preventative foot care services. No changes to ROS.  Patient is taking eliquiss.  Podiatric Exam: Vascular: dorsalis pedis and posterior tibial pulses are weakly  palpable bilateral. Capillary return is immediate. Temperature gradient is WNL. Skin turgor WNL . Swelling legs/feet  B/L. Sensorium: Diminished  Semmes Weinstein monofilament test. Normal tactile sensation bilaterally. Nail Exam: Pt has thick disfigured discolored nails with subungual debris noted bilateral entire nail hallux through fifth toenails Ulcer Exam: There is no evidence of ulcer or pre-ulcerative changes or infection. Orthopedic Exam: Muscle tone and strength are WNL. No limitations in general ROM. No crepitus or effusions noted. Foot type and digits show no abnormalities. Bony prominences are unremarkable. Skin: No Porokeratosis. No infection or ulcers  Diagnosis:  Onychomycosis, , Pain in right toe, pain in left toes  Treatment & Plan Procedures and Treatment: Consent by patient was obtained for treatment procedures.   Debridement of mycotic and hypertrophic toenails, 1 through 5 bilateral and clearing of subungual debris. No ulceration, no infection noted.  Return Visit-Office Procedure: Patient instructed to return to the office for a follow up visit 3 months for continued evaluation and treatment.  This patient is brought to the office by her daughter.  Her daughter says that her mother  was seen at the urgent care center for a blister on the front of her left leg.  She says she had her blister lanced and placed on doxycycline.  She presents to the office and the blister site has exposed subcutaneous tissue with clear drainage noted from the open wound site.   I am concerned about this open wound.   This wound was bandaged and she will be sent to the wound care center in [place].  A referral will be sent for this patient.    Gardiner Barefoot DPM

## 2019-07-02 ENCOUNTER — Encounter: Payer: Self-pay | Admitting: Podiatry

## 2019-07-02 NOTE — Addendum Note (Signed)
Addended by: Graceann Congress D on: 07/02/2019 07:52 AM   Modules accepted: Orders

## 2019-07-02 NOTE — Progress Notes (Unsigned)
Angie, please send this patient to the wound care center in [facility] for open wound on her left lower leg.  Gardiner Barefoot DPM

## 2019-07-12 ENCOUNTER — Other Ambulatory Visit: Payer: Self-pay

## 2019-07-12 ENCOUNTER — Encounter: Payer: Medicare HMO | Attending: Physician Assistant | Admitting: Physician Assistant

## 2019-07-12 DIAGNOSIS — Z8249 Family history of ischemic heart disease and other diseases of the circulatory system: Secondary | ICD-10-CM | POA: Diagnosis not present

## 2019-07-12 DIAGNOSIS — F0150 Vascular dementia without behavioral disturbance: Secondary | ICD-10-CM | POA: Diagnosis not present

## 2019-07-12 DIAGNOSIS — I252 Old myocardial infarction: Secondary | ICD-10-CM | POA: Insufficient documentation

## 2019-07-12 DIAGNOSIS — M1990 Unspecified osteoarthritis, unspecified site: Secondary | ICD-10-CM | POA: Diagnosis not present

## 2019-07-12 DIAGNOSIS — L97812 Non-pressure chronic ulcer of other part of right lower leg with fat layer exposed: Secondary | ICD-10-CM | POA: Diagnosis present

## 2019-07-12 DIAGNOSIS — I872 Venous insufficiency (chronic) (peripheral): Secondary | ICD-10-CM | POA: Diagnosis not present

## 2019-07-12 DIAGNOSIS — Z809 Family history of malignant neoplasm, unspecified: Secondary | ICD-10-CM | POA: Diagnosis not present

## 2019-07-12 DIAGNOSIS — L97822 Non-pressure chronic ulcer of other part of left lower leg with fat layer exposed: Secondary | ICD-10-CM | POA: Diagnosis not present

## 2019-07-12 DIAGNOSIS — Z833 Family history of diabetes mellitus: Secondary | ICD-10-CM | POA: Diagnosis not present

## 2019-07-12 DIAGNOSIS — I10 Essential (primary) hypertension: Secondary | ICD-10-CM | POA: Diagnosis not present

## 2019-07-12 NOTE — Progress Notes (Signed)
CHAU, SAVELL ([id number]) Visit Report for 07/12/2019 Allergy List Details Patient Name: April Powers, April Powers Date of Service: 07/12/2019 1:15 PM Medical Record Number: [id number] Patient Account Number: [id number] Date of Birth/Sex: [birth year] (85 y.o. F) Treating RN: Montey Hora Primary Care Mounir Skipper: Evern Bio Other Clinician: Referring Sierra Bissonette: Gardiner Barefoot Treating Dayvian Blixt/Extender: STONE III, HOYT Weeks in Treatment: 0 Allergies Active Allergies No Known Drug Allergies Allergy Notes Electronic Signature(s) Signed: 07/12/2019 3:56:20 PM By: Montey Hora Entered By: Montey Hora on 07/12/2019 13:33:54 April Powers ([id number]) -------------------------------------------------------------------------------- Arrival Information Details Patient Name: April Powers Date of Service: 07/12/2019 1:15 PM Medical Record Number: [id number] Patient Account Number: [id number] Date of Birth/Sex: [birth year] (85 y.o. F) Treating RN: Montey Hora Primary Care Tawanda Schall: Evern Bio Other Clinician: Referring Roosevelt Bisher: Gardiner Barefoot Treating Ruble Pumphrey/Extender: Melburn Hake, HOYT Weeks in Treatment: 0 Visit Information Patient Arrived: Cane Arrival Time: 13:29 Accompanied By: sister Transfer Assistance: None Patient Identification Verified: Yes Secondary Verification Process Yes Completed: Patient Has Alerts: Yes Patient Alerts: Patient on Blood Thinner Eliquis Electronic Signature(s) Signed: 07/12/2019 3:56:20 PM By: Montey Hora Entered By: Montey Hora on 07/12/2019 13:31:19 April Powers ([id number]) -------------------------------------------------------------------------------- Clinic Level of Care Assessment Details Patient Name: April Powers Date of Service: 07/12/2019 1:15 PM Medical Record Number: [id number] Patient Account Number: [id number] Date of Birth/Sex: Mar 05, 1928 (85 y.o. F) Treating RN: Cornell Barman Primary Care Zaki Gertsch: Evern Bio  Other Clinician: Referring Khyri Hinzman: Gardiner Barefoot Treating Aubrielle Stroud/Extender: Melburn Hake, HOYT Weeks in Treatment: 0 Clinic Level of Care Assessment Items TOOL 1 Quantity Score []  - Use when EandM and Procedure is performed on INITIAL visit 0 ASSESSMENTS - Nursing Assessment / Reassessment X - General Physical Exam (combine w/ comprehensive assessment (listed just below) when 1 20 performed on new pt. evals) X- 1 25 Comprehensive Assessment (HX, ROS, Risk Assessments, Wounds Hx, etc.) ASSESSMENTS - Wound and Skin Assessment / Reassessment []  - Dermatologic / Skin Assessment (not related to wound area) 0 ASSESSMENTS - Ostomy and/or Continence Assessment and Care []  - Incontinence Assessment and Management 0 []  - 0 Ostomy Care Assessment and Management (repouching, etc.) PROCESS - Coordination of Care X - Simple Patient / Family Education for ongoing care 1 15 []  - 0 Complex (extensive) Patient / Family Education for ongoing care X- 1 10 Staff obtains Programmer, systems, Records, Test Results / Process Orders []  - 0 Staff telephones HHA, Nursing Homes / Clarify orders / etc []  - 0 Routine Transfer to another Facility (non-emergent condition) []  - 0 Routine Hospital Admission (non-emergent condition) X- 1 15 New Admissions / Biomedical engineer / Ordering NPWT, Apligraf, etc. []  - 0 Emergency Hospital Admission (emergent condition) PROCESS - Special Needs []  - Pediatric / Minor Patient Management 0 []  - 0 Isolation Patient Management []  - 0 Hearing / Language / Visual special needs []  - 0 Assessment of Community assistance (transportation, D/C planning, etc.) []  - 0 Additional assistance / Altered mentation []  - 0 Support Surface(s) Assessment (bed, cushion, seat, etc.) MARINE, LEZOTTE ([id number]) INTERVENTIONS - Miscellaneous []  - External ear exam 0 []  - 0 Patient Transfer (multiple staff / Civil Service fast streamer / Similar devices) []  - 0 Simple Staple / Suture removal (25 or  less) []  - 0 Complex Staple / Suture removal (26 or more) []  - 0 Hypo/Hyperglycemic Management (do not check if billed separately) X- 1 15 Ankle / Brachial Index (ABI) - do not check if billed separately Has the patient been seen at the hospital within the last three years: Yes Total Score: 100 Level Of Care:  New/Established - Level 3 Electronic Signature(s) Signed: 07/12/2019 4:49:27 PM By: Elliot Gurney, BSN, RN, CWS, Kim RN, BSN Entered By: Elliot Gurney, BSN, RN, CWS, Kim on 07/12/2019 14:11:41 April Powers ([id number]) -------------------------------------------------------------------------------- Encounter Discharge Information Details Patient Name: April Powers Date of Service: 07/12/2019 1:15 PM Medical Record Number: [id number] Patient Account Number: [id number] Date of Birth/Sex: 1927-11-18 (85 y.o. F) Treating RN: Huel Coventry Primary Care Deion Swift: Orson Eva Other Clinician: Referring Lela Gell: Helane Gunther Treating Brieonna Crutcher/Extender: Linwood Dibbles, HOYT Weeks in Treatment: 0 Encounter Discharge Information Items Discharge Condition: Stable Ambulatory Status: Cane Discharge Destination: Home Transportation: Private Auto Accompanied By: Sister Schedule Follow-up Appointment: Yes Clinical Summary of Care: Electronic Signature(s) Signed: 07/12/2019 4:49:27 PM By: Elliot Gurney, BSN, RN, CWS, Kim RN, BSN Entered By: Elliot Gurney, BSN, RN, CWS, Kim on 07/12/2019 14:16:40 April Powers ([id number]) -------------------------------------------------------------------------------- Lower Extremity Assessment Details Patient Name: April Powers Date of Service: 07/12/2019 1:15 PM Medical Record Number: [id number] Patient Account Number: [id number] Date of Birth/Sex: [birth year] (85 y.o. F) Treating RN: Curtis Sites Primary Care Areyanna Figeroa: Orson Eva Other Clinician: Referring Desean Heemstra: Helane Gunther Treating Aleya Durnell/Extender: Linwood Dibbles, HOYT Weeks in Treatment: 0 Edema  Assessment Assessed: [Left: No] [Right: No] Edema: [Left: Yes] [Right: Yes] Calf Left: Right: Point of Measurement: 34 cm From Medial Instep 38 cm 39 cm Ankle Left: Right: Point of Measurement: 10 cm From Medial Instep 24 cm 24 cm Vascular Assessment Pulses: Dorsalis Pedis Palpable: [Left:Yes] [Right:Yes] Doppler Audible: [Left:Yes] [Right:Yes] Posterior Tibial Palpable: [Left:Yes] [Right:Yes] Doppler Audible: [Left:Yes] [Right:Yes] Blood Pressure: Brachial: [Left:124] [Right:132] Dorsalis Pedis: 126 [Left:Dorsalis Pedis: 132] Ankle: Posterior Tibial: 108 [Left:Posterior Tibial: 114 0.95] [Right:1.00] Electronic Signature(s) Signed: 07/12/2019 3:56:20 PM By: Curtis Sites Entered By: Curtis Sites on 07/12/2019 13:56:17 April Powers ([id number]) -------------------------------------------------------------------------------- Multi Wound Chart Details Patient Name: April Powers Date of Service: 07/12/2019 1:15 PM Medical Record Number: [id number] Patient Account Number: [id number] Date of Birth/Sex: October 09, 1927 (85 y.o. F) Treating RN: Huel Coventry Primary Care Davelle Anselmi: Orson Eva Other Clinician: Referring Jerred Zaremba: Helane Gunther Treating Kjirsten Bloodgood/Extender: Linwood Dibbles, HOYT Weeks in Treatment: 0 Vital Signs Height(in): 68 Pulse(bpm): 94 Weight(lbs): 180 Blood Pressure(mmHg): 130/82 Body Mass Index(BMI): 27 Temperature(F): 98.1 Respiratory Rate 16 (breaths/min): Photos: [N/A:N/A] Wound Location: Left Lower Leg - Medial Right Lower Leg - Anterior N/A Wounding Event: Blister Blister N/A Primary Etiology: Venous Leg Ulcer Venous Leg Ulcer N/A Comorbid History: Hypertension, Myocardial Hypertension, Myocardial N/A Infarction, Osteoarthritis, Infarction, Osteoarthritis, Dementia Dementia Date Acquired: 07/02/2019 07/02/2019 N/A Weeks of Treatment: 0 0 N/A Wound Status: Open Open N/A Measurements L x W x D 4.7x5.9x0.1 0.5x0.4x0.1 N/A (cm) Area (cm) :  21.779 0.157 N/A Volume (cm) : 2.178 0.016 N/A Classification: Full Thickness Without Full Thickness Without N/A Exposed Support Structures Exposed Support Structures Exudate Amount: Large Large N/A Exudate Type: Serous Serous N/A Exudate Color: amber amber N/A Wound Margin: Flat and Intact Flat and Intact N/A Granulation Amount: Large (67-100%) Medium (34-66%) N/A Granulation Quality: Pink Pink N/A Necrotic Amount: Small (1-33%) Medium (34-66%) N/A Exposed Structures: Fat Layer (Subcutaneous Fat Layer (Subcutaneous N/A Tissue) Exposed: Yes Tissue) Exposed: Yes Fascia: No Fascia: No Tendon: No Tendon: No Muscle: No Muscle: No Joint: No Joint: No Bone: No Bone: No April Powers, April Powers ([id number]) Epithelialization: None None N/A Treatment Notes Electronic Signature(s) Signed: 07/12/2019 4:49:27 PM By: Elliot Gurney, BSN, RN, CWS, Kim RN, BSN Entered By: Elliot Gurney, BSN, RN, CWS, Kim on 07/12/2019 14:07:16 April Powers ([id number]) -------------------------------------------------------------------------------- Multi-Disciplinary Care Plan Details Patient Name: April Powers Date of Service: 07/12/2019 1:15 PM Medical Record Number: [id number] Patient  Account Number: [id number] Date of Birth/Sex: 09-Jul-1928 (85 y.o. F) Treating RN: Huel Coventry Primary Care Makenzie Weisner: Orson Eva Other Clinician: Referring Melven Stockard: Helane Gunther Treating Brandell Maready/Extender: Linwood Dibbles, HOYT Weeks in Treatment: 0 Active Inactive Orientation to the Wound Care Program Nursing Diagnoses: Knowledge deficit related to the wound healing center program Goals: Patient/caregiver will verbalize understanding of the Wound Healing Center Program Date Initiated: 07/12/2019 Target Resolution Date: 08/12/2019 Goal Status: Active Interventions: Provide education on orientation to the wound center Notes: Venous Leg Ulcer Nursing Diagnoses: Potential for venous Insuffiency (use before diagnosis  confirmed) Goals: Patient will maintain optimal edema control Date Initiated: 07/12/2019 Target Resolution Date: 08/12/2019 Goal Status: Active Interventions: Assess peripheral edema status every visit. Treatment Activities: Therapeutic compression applied : 07/12/2019 Notes: Wound/Skin Impairment Nursing Diagnoses: Impaired tissue integrity Goals: Patient/caregiver will verbalize understanding of skin care regimen Date Initiated: 07/12/2019 Target Resolution Date: 07/12/2019 Goal Status: Active April Powers, April Powers ([id number]) Ulcer/skin breakdown will have a volume reduction of 30% by week 4 Date Initiated: 07/12/2019 Target Resolution Date: 08/12/2019 Goal Status: Active Interventions: Assess ulceration(s) every visit Treatment Activities: Skin care regimen initiated : 07/12/2019 Topical wound management initiated : 07/12/2019 Notes: Electronic Signature(s) Signed: 07/12/2019 4:49:27 PM By: Elliot Gurney, BSN, RN, CWS, Kim RN, BSN Entered By: Elliot Gurney, BSN, RN, CWS, Kim on 07/12/2019 14:07:00 April Powers ([id number]) -------------------------------------------------------------------------------- Pain Assessment Details Patient Name: April Powers Date of Service: 07/12/2019 1:15 PM Medical Record Number: [id number] Patient Account Number: [id number] Date of Birth/Sex: 1928-09-25 (85 y.o. F) Treating RN: Curtis Sites Primary Care Briunna Leicht: Orson Eva Other Clinician: Referring Caelyn Route: Helane Gunther Treating Orissa Arreaga/Extender: Linwood Dibbles, HOYT Weeks in Treatment: 0 Active Problems Location of Pain Severity and Description of Pain Patient Has Paino No Site Locations With Dressing Change: No Pain Management and Medication Current Pain Management: Electronic Signature(s) Signed: 07/12/2019 3:56:20 PM By: Curtis Sites Entered By: Curtis Sites on 07/12/2019 13:31:39 April Powers  ([id number]) -------------------------------------------------------------------------------- Patient/Caregiver Education Details Patient Name: April Powers Date of Service: 07/12/2019 1:15 PM Medical Record Number: [id number] Patient Account Number: [id number] Date of Birth/Gender: 1928-08-31 (85 y.o. F) Treating RN: Huel Coventry Primary Care Physician: Orson Eva Other Clinician: Referring Physician: Helane Gunther Treating Physician/Extender: Skeet Simmer in Treatment: 0 Education Assessment Education Provided To: Patient Education Topics Provided Welcome To The Wound Care Center: Handouts: Welcome To The Wound Care Center Methods: Demonstration, Explain/Verbal Responses: State content correctly Wound/Skin Impairment: Handouts: Caring for Your Ulcer Methods: Demonstration, Explain/Verbal Responses: State content correctly Electronic Signature(s) Signed: 07/12/2019 4:49:27 PM By: Elliot Gurney, BSN, RN, CWS, Kim RN, BSN Entered By: Elliot Gurney, BSN, RN, CWS, Kim on 07/12/2019 14:12:13 April Powers ([id number]) -------------------------------------------------------------------------------- Wound Assessment Details Patient Name: April Powers Date of Service: 07/12/2019 1:15 PM Medical Record Number: [id number] Patient Account Number: [id number] Date of Birth/Sex: 12-27-1927 (85 y.o. F) Treating RN: Curtis Sites Primary Care Ronne Savoia: Orson Eva Other Clinician: Referring Loman Logan: Helane Gunther Treating Adonay Scheier/Extender: STONE III, HOYT Weeks in Treatment: 0 Wound Status Wound Number: 1 Primary Venous Leg Ulcer Etiology: Wound Location: Left Lower Leg - Medial Wound Status: Open Wounding Event: Blister Comorbid Hypertension, Myocardial Infarction, Date Acquired: 07/02/2019 History: Osteoarthritis, Dementia Weeks Of Treatment: 0 Clustered Wound: No Photos Wound Measurements Length: (cm) 4.7 % Reduct Width: (cm) 5.9 % Reduct Depth: (cm) 0.1 Epitheli Area:  (cm) 21.779 Tunneli Volume: (cm) 2.178 Undermi ion in Area: ion in Volume: alization: None ng: No ning: No Wound Description Full Thickness Without Exposed Support Foul Od Classification: Structures Slough/ Wound Margin: Flat and Intact Exudate  Large Amount: Exudate Type: Serous Exudate Color: amber or After Cleansing: No Fibrino Yes Wound Bed Granulation Amount: Large (67-100%) Exposed Structure Granulation Quality: Pink Fascia Exposed: No Necrotic Amount: Small (1-33%) Fat Layer (Subcutaneous Tissue) Exposed: Yes Necrotic Quality: Adherent Slough Tendon Exposed: No Muscle Exposed: No Joint Exposed: No Bone Exposed: No Mabus, Kendrah ([id number]) Treatment Notes Wound #1 (Left, Medial Lower Leg) Notes Silver Cell, X-sorb, 3L(B), unna to anchor Electronic Signature(s) Signed: 07/12/2019 3:56:20 PM By: Curtis Sitesorthy, Joanna Entered By: Curtis Sitesorthy, Joanna on 07/12/2019 13:47:14 April Powers, April Powers ([id number]) -------------------------------------------------------------------------------- Wound Assessment Details Patient Name: April Powers, April Powers Date of Service: 07/12/2019 1:15 PM Medical Record Number: [id number] Patient Account Number: [id number] Date of Birth/Sex: March 20, 1928 (85 y.o. F) Treating RN: Curtis Sitesorthy, Joanna Primary Care Jiali Linney: Orson EvaBOSWELL, CHELSA Other Clinician: Referring Destani Wamser: Helane GuntherMAYER, GREGORY Treating Clothilde Tippetts/Extender: Linwood DibblesSTONE III, HOYT Weeks in Treatment: 0 Wound Status Wound Number: 2 Primary Venous Leg Ulcer Etiology: Wound Location: Right Lower Leg - Anterior Wound Status: Open Wounding Event: Blister Comorbid Hypertension, Myocardial Infarction, Date Acquired: 07/02/2019 History: Osteoarthritis, Dementia Weeks Of Treatment: 0 Clustered Wound: No Photos Wound Measurements Length: (cm) 0.5 % Reduct Width: (cm) 0.4 % Reduct Depth: (cm) 0.1 Epitheli Area: (cm) 0.157 Tunneli Volume: (cm) 0.016 Undermi ion in Area: ion in Volume: alization: None ng:  No ning: No Wound Description Full Thickness Without Exposed Support Foul Od Classification: Structures Slough/ Wound Margin: Flat and Intact Exudate Large Amount: Exudate Type: Serous Exudate Color: amber or After Cleansing: No Fibrino Yes Wound Bed Granulation Amount: Medium (34-66%) Exposed Structure Granulation Quality: Pink Fascia Exposed: No Necrotic Amount: Medium (34-66%) Fat Layer (Subcutaneous Tissue) Exposed: Yes Necrotic Quality: Adherent Slough Tendon Exposed: No Muscle Exposed: No Joint Exposed: No Bone Exposed: No Rebello, Avo ([id number]) Treatment Notes Wound #2 (Right, Anterior Lower Leg) Notes Silver Cell, X-sorb, 3L(B), unna to anchor Electronic Signature(s) Signed: 07/12/2019 3:56:20 PM By: Curtis Sitesorthy, Joanna Entered By: Curtis Sitesorthy, Joanna on 07/12/2019 13:48:21 April Powers, April Powers ([id number]) -------------------------------------------------------------------------------- Vitals Details Patient Name: April Powers, April Powers Date of Service: 07/12/2019 1:15 PM Medical Record Number: [id number] Patient Account Number: [id number] Date of Birth/Sex: March 20, 1928 (85 y.o. F) Treating RN: Curtis Sitesorthy, Joanna Primary Care Kendric Sindelar: Orson EvaBOSWELL, CHELSA Other Clinician: Referring Catalyna Reilly: Helane GuntherMAYER, GREGORY Treating Laban Orourke/Extender: Linwood DibblesSTONE III, HOYT Weeks in Treatment: 0 Vital Signs Time Taken: 13:31 Temperature (F): 98.1 Height (in): 68 Pulse (bpm): 94 Source: Measured Respiratory Rate (breaths/min): 16 Weight (lbs): 180 Blood Pressure (mmHg): 130/82 Source: Measured Reference Range: 80 - 120 mg / dl Body Mass Index (BMI): 27.4 Electronic Signature(s) Signed: 07/12/2019 3:56:20 PM By: Curtis Sitesorthy, Joanna Entered By: Curtis Sitesorthy, Joanna on 07/12/2019 13:33:26

## 2019-07-12 NOTE — Progress Notes (Signed)
April Powers, April Powers ([id number]) Visit Report for 07/12/2019 Abuse/Suicide Risk Screen Details Patient Name: April Powers, April Powers Date of Service: 07/12/2019 1:15 PM Medical Record Number: [id number] Patient Account Number: [id number] Date of Birth/Sex: [birth year] (85 y.o. F) Treating RN: Montey Hora Primary Care Mialee Weyman: Evern Bio Other Clinician: Referring Stevi Hollinshead: Gardiner Barefoot Treating Deanndra Kirley/Extender: Melburn Hake, HOYT Weeks in Treatment: 0 Abuse/Suicide Risk Screen Items Answer ABUSE RISK SCREEN: Has anyone close to you tried to hurt or harm you recentlyo No Do you feel uncomfortable with anyone in your familyo No Has anyone forced you do things that you didnot want to doo No Electronic Signature(s) Signed: 07/12/2019 3:56:20 PM By: Montey Hora Entered By: Montey Hora on 07/12/2019 13:37:19 April Powers ([id number]) -------------------------------------------------------------------------------- Activities of Daily Living Details Patient Name: April Powers Date of Service: 07/12/2019 1:15 PM Medical Record Number: [id number] Patient Account Number: [id number] Date of Birth/Sex: [birth year] (85 y.o. F) Treating RN: Montey Hora Primary Care Deshawn Witty: Evern Bio Other Clinician: Referring Tayten Bergdoll: Gardiner Barefoot Treating Rashod Gougeon/Extender: Melburn Hake, HOYT Weeks in Treatment: 0 Activities of Daily Living Items Answer Activities of Daily Living (Please select one for each item) Drive Automobile Not Able Take Medications Need Assistance Use Telephone Need Assistance Care for Appearance Need Assistance Use Toilet Need Assistance Bath / Shower Need Assistance Dress Self Need Assistance Feed Self Completely Able Walk Need Assistance Get In / Out Bed Completely [place] Need Assistance Shop for Self Need Assistance Electronic Signature(s) Signed: 07/12/2019 3:56:20 PM By: Montey Hora Entered By: Montey Hora on 07/12/2019 13:37:47 April Powers ([id number]) -------------------------------------------------------------------------------- Education Screening Details Patient Name: April Powers Date of Service: 07/12/2019 1:15 PM Medical Record Number: [id number] Patient Account Number: [id number] Date of Birth/Sex: 02-10-1928 (85 y.o. F) Treating RN: Montey Hora Primary Care Malessa Zartman: Evern Bio Other Clinician: Referring Tammey Deeg: Gardiner Barefoot Treating Latravis Grine/Extender: Melburn Hake, HOYT Weeks in Treatment: 0 Primary Learner Assessed: Caregiver Reason Patient is not Primary Learner: Dementia Learning Preferences/Education Level/Primary Language Learning Preference: Explanation, Demonstration Highest Education Level: High School Preferred Language: English Cognitive Barrier Language Barrier: No Translator Needed: No Memory Deficit: Yes dementia Emotional Barrier: No Cultural/Religious Beliefs Affecting Medical Care: No Physical Barrier Impaired Vision: No Impaired Hearing: No Decreased Hand dexterity: No Knowledge/Comprehension Knowledge Level: Medium Comprehension Level: Medium Ability to understand written Medium instructions: Ability to understand verbal Medium instructions: Motivation Anxiety Level: Calm Cooperation: Cooperative Education Importance: Acknowledges Need Interest in Health Problems: Asks Questions Perception: Coherent Willingness to Engage in Self- Medium Management Activities: Readiness to Engage in Self- Medium Management Activities: Electronic Signature(s) Signed: 07/12/2019 3:56:20 PM By: Montey Hora Entered By: Montey Hora on 07/12/2019 13:38:19 April Powers ([id number]) -------------------------------------------------------------------------------- Fall Risk Assessment Details Patient Name: April Powers Date of Service: 07/12/2019 1:15 PM Medical Record Number: [id number] Patient Account  Number: [id number] Date of Birth/Sex: Nov 13, 1927 (85 y.o. F) Treating RN: Montey Hora Primary Care Pearce Littlefield: Evern Bio Other Clinician: Referring Zaidyn Claire: Gardiner Barefoot Treating Simona Rocque/Extender: Melburn Hake, HOYT Weeks in Treatment: 0 Fall Risk Assessment Items Have you had 2 or more falls in the last 12 monthso 0 Yes Have you had any fall that resulted in injury in the last 12 monthso 0 No FALLS RISK SCREEN History of falling - immediate or within 3 months 25 Yes Secondary diagnosis (Do you have 2 or more medical diagnoseso) 0 No Ambulatory aid None/bed rest/wheelchair/nurse 0 No Crutches/cane/walker 15 Yes Furniture 0 No Intravenous therapy Access/Saline/Heparin Lock 0 No Gait/Transferring Normal/ bed rest/ wheelchair 0 No Weak (  short steps with or without shuffle, stooped but able to lift head while 10 Yes walking, may seek support from furniture) Impaired (short steps with shuffle, may have difficulty arising from chair, head 0 No down, impaired balance) Mental Status Oriented to own ability 0 Yes Electronic Signature(s) Signed: 07/12/2019 3:56:20 PM By: Curtis Sites Entered By: Curtis Sites on 07/12/2019 13:38:37 April Powers ([id number]) -------------------------------------------------------------------------------- Foot Assessment Details Patient Name: April Powers Date of Service: 07/12/2019 1:15 PM Medical Record Number: [id number] Patient Account Number: [id number] Date of Birth/Sex: 01-19-1928 (85 y.o. F) Treating RN: Curtis Sites Primary Care Helmut Hennon: Orson Eva Other Clinician: Referring Carlyne Keehan: Helane Gunther Treating Mary-Ann Pennella/Extender: Linwood Dibbles, HOYT Weeks in Treatment: 0 Foot Assessment Items Site Locations + = Sensation present, - = Sensation absent, C = Callus, U = Ulcer R = Redness, W = Warmth, M = Maceration, PU = Pre-ulcerative lesion F = Fissure, S = Swelling, D = Dryness Assessment Right: Left: Other Deformity: No  No Prior Foot Ulcer: No No Prior Amputation: No No Charcot Joint: No No Ambulatory Status: Ambulatory With Help Assistance Device: Cane Gait: Academic librarian Signature(s) Signed: 07/12/2019 3:56:20 PM By: Curtis Sites Entered By: Curtis Sites on 07/12/2019 13:38:54 April Powers ([id number]) -------------------------------------------------------------------------------- Nutrition Risk Screening Details Patient Name: April Powers Date of Service: 07/12/2019 1:15 PM Medical Record Number: [id number] Patient Account Number: [id number] Date of Birth/Sex: [birth year] (85 y.o. F) Treating RN: Curtis Sites Primary Care Aletha Allebach: Orson Eva Other Clinician: Referring Kyeisha Janowicz: Helane Gunther Treating Rhealyn Cullen/Extender: Linwood Dibbles, HOYT Weeks in Treatment: 0 Height (in): 68 Weight (lbs): 180 Body Mass Index (BMI): 27.4 Nutrition Risk Screening Items Score Screening NUTRITION RISK SCREEN: I have an illness or condition that made me change the kind and/or amount of 0 No food I eat I eat fewer than two meals per day 0 No I eat few fruits and vegetables, or milk products 0 No I have three or more drinks of beer, liquor or wine almost every day 0 No I have tooth or mouth problems that make it hard for me to eat 0 No I don't always have enough money to buy the food I need 0 No I eat alone most of the time 0 No I take three or more different prescribed or over-the-counter drugs a day 1 Yes Without wanting to, I have lost or gained 10 pounds in the last six months 0 No I am not always physically able to shop, cook and/or feed myself 0 No Nutrition Protocols Good Risk Protocol 0 No interventions needed Moderate Risk Protocol High Risk Proctocol Risk Level: Good Risk Score: 1 Electronic Signature(s) Signed: 07/12/2019 3:56:20 PM By: Curtis Sites Entered By: Curtis Sites on 07/12/2019 13:38:44

## 2019-07-12 NOTE — Progress Notes (Signed)
DANNY, ZIMNY ([id number]) Visit Report for 07/12/2019 Chief Complaint Document Details Patient Name: April Powers, April Powers Date of Service: 07/12/2019 1:15 PM Medical Record Number: [id number] Patient Account Number: [id number] Date of Birth/Sex: Aug 10, 1928 (85 y.o. F) Treating RN: Cornell Barman Primary Care Provider: Evern Bio Other Clinician: Referring Provider: Gardiner Barefoot Treating Provider/Extender: Melburn Hake, Joclynn Lumb Weeks in Treatment: 0 Information Obtained from: Patient Chief Complaint Bilateral LE Ulcers Electronic Signature(s) Signed: 07/12/2019 2:00:35 PM By: Worthy Keeler PA-C Entered By: Worthy Keeler on 07/12/2019 14:00:35 April Powers ([id number]) -------------------------------------------------------------------------------- HPI Details Patient Name: April Powers Date of Service: 07/12/2019 1:15 PM Medical Record Number: [id number] Patient Account Number: [id number] Date of Birth/Sex: May 29, 1928 (85 y.o. F) Treating RN: Cornell Barman Primary Care Provider: Evern Bio Other Clinician: Referring Provider: Gardiner Barefoot Treating Provider/Extender: Melburn Hake, Jermaine Tholl Weeks in Treatment: 0 History of Present Illness HPI Description: 07/12/2019 on evaluation today patient appears for initial evaluation here in our clinic today concerning an issue that is been happening with her bilateral lower extremities and this began about 10 days ago. She does have bilateral lower extremity edema secondary to venous stasis. This does appear to be stage I lymphedema most likely. With that being said there is no signs of active infection at this time which is good news. That was one concern of her sister was that the patient may have an infection and required antibiotic. The good news is that does not seem to be the case. With that being said I do think the patient needs good edema control at this point. She has previously had compression stockings but is been quite some time since  she has worn them according to her sister who appears to be her primary caregiver and who is with her today. The patient has had a stroke and really does not talk much though fortunately other than the stroke does not appear to have a significant past medical history other than hypertension and dementia. No sharp debridement is good to be required today which is excellent news. With that being said I do believe that compression is good to be of utmost importance as far as getting these wounds to heal as well as keeping new areas from occurring. Electronic Signature(s) Signed: 07/12/2019 2:41:28 PM By: Worthy Keeler PA-C Entered By: Worthy Keeler on 07/12/2019 14:41:28 April Powers ([id number]) -------------------------------------------------------------------------------- Physical Exam Details Patient Name: April Powers Date of Service: 07/12/2019 1:15 PM Medical Record Number: [id number] Patient Account Number: [id number] Date of Birth/Sex: [birth year] (85 y.o. F) Treating RN: Cornell Barman Primary Care Provider: Evern Bio Other Clinician: Referring Provider: Gardiner Barefoot Treating Provider/Extender: Melburn Hake, Nalayah Hitt Weeks in Treatment: 0 Constitutional sitting or standing blood pressure is within target range for patient.. pulse regular and within target range for patient.Marland Kitchen respirations regular, non-labored and within target range for patient.Marland Kitchen temperature within target range for patient.. Well- nourished and well-hydrated in no acute distress. Eyes conjunctiva clear no eyelid edema noted. pupils equal round and reactive to light and accommodation. Ears, Nose, Mouth, and Throat no gross abnormality of ear auricles or external auditory canals. normal hearing noted during conversation. mucus membranes moist. Respiratory normal breathing without difficulty. clear to auscultation bilaterally. Cardiovascular regular rate and rhythm with normal S1, S2. 2+ dorsalis pedis/posterior  tibialis pulses. 2+ pitting edema of the bilateral lower extremities. Gastrointestinal (GI) soft, non-tender, non-distended, +BS. no ventral hernia noted. Musculoskeletal Patient unable to walk without assistance. no significant deformity or arthritic changes, no loss or range of motion, no clubbing.  Psychiatric Patient is not able to cooperate in decision making regarding care. Patient is oriented to person only. patient is confused. Notes Upon inspection today patient's wound bed actually showed signs of good granulation on the bilateral lower extremities at the open locations. With that being said she does have a significant amount of edema as well as weeping noted at this point bilaterally. The wound on the left is significantly larger than the wound on the right. Electronic Signature(s) Signed: 07/12/2019 2:42:29 PM By: Lenda Kelp PA-C Entered By: Lenda Kelp on 07/12/2019 14:42:28 April Powers ([id number]) -------------------------------------------------------------------------------- Physician Orders Details Patient Name: April Powers Date of Service: 07/12/2019 1:15 PM Medical Record Number: [id number] Patient Account Number: [id number] Date of Birth/Sex: [birth year] (85 y.o. F) Treating RN: Huel Coventry Primary Care Provider: Orson Eva Other Clinician: Referring Provider: Helane Gunther Treating Provider/Extender: Linwood Dibbles, Caroleen Stoermer Weeks in Treatment: 0 Verbal / Phone Orders: No Diagnosis Coding ICD-10 Coding Code Description I87.2 Venous insufficiency (chronic) (peripheral) L97.822 Non-pressure chronic ulcer of other part of left lower leg with fat layer exposed L97.812 Non-pressure chronic ulcer of other part of right lower leg with fat layer exposed F01.50 Vascular dementia without behavioral disturbance I10 Essential (primary) hypertension Wound Cleansing Wound #1 Left,Medial Lower Leg o No tub bath. o Other: - Do not get dressings wet. Wound #2  Right,Anterior Lower Leg o No tub bath. o Other: - Do not get dressings wet. Anesthetic (add to Medication List) Wound #1 Left,Medial Lower Leg o Topical Lidocaine 4% cream applied to wound bed prior to debridement (In Clinic Only). Wound #2 Right,Anterior Lower Leg o Topical Lidocaine 4% cream applied to wound bed prior to debridement (In Clinic Only). Primary Wound Dressing Wound #1 Left,Medial Lower Leg o Silver Alginate Wound #2 Right,Anterior Lower Leg o Silver Alginate Secondary Dressing Wound #1 Left,Medial Lower Leg o Other - xtrasorb Wound #2 Right,Anterior Lower Leg o Other - xtrasorb Dressing Change Frequency Wound #1 Left,Medial Lower Leg o Change dressing every week LARYN, VENNING ([id number]) Wound #2 Right,Anterior Lower Leg o Change dressing every week Follow-up Appointments Wound #1 Left,Medial Lower Leg o Return Appointment in 1 week. o Nurse Visit as needed Wound #2 Right,Anterior Lower Leg o Return Appointment in 1 week. o Nurse Visit as needed Edema Control Wound #1 Left,Medial Lower Leg o 3 Layer Compression System - Bilateral o Elevate legs to the level of the heart and pump ankles as often as possible Wound #2 Right,Anterior Lower Leg o 3 Layer Compression System - Bilateral o Elevate legs to the level of the heart and pump ankles as often as possible Electronic Signature(s) Signed: 07/12/2019 4:22:49 PM By: Lenda Kelp PA-C Signed: 07/12/2019 4:49:27 PM By: Elliot Gurney, BSN, RN, CWS, Kim RN, BSN Entered By: Elliot Gurney, BSN, RN, CWS, Kim on 07/12/2019 14:11:10 April Powers ([id number]) -------------------------------------------------------------------------------- Problem List Details Patient Name: April Powers Date of Service: 07/12/2019 1:15 PM Medical Record Number: [id number] Patient Account Number: [id number] Date of Birth/Sex: 23-Sep-1928 (85 y.o. F) Treating RN: Huel Coventry Primary Care Provider: Orson Eva Other Clinician: Referring Provider: Helane Gunther Treating Provider/Extender: Linwood Dibbles, Batoul Limes Weeks in Treatment: 0 Active Problems ICD-10 Evaluated Encounter Code Description Active Date Today Diagnosis I89.0 Lymphedema, not elsewhere classified 07/12/2019 No Yes I87.2 Venous insufficiency (chronic) (peripheral) 07/12/2019 No Yes L97.822 Non-pressure chronic ulcer of other part of left lower leg with 07/12/2019 No Yes fat layer exposed L97.812 Non-pressure chronic ulcer of other part of right lower leg 07/12/2019 No Yes with fat layer  exposed F01.50 Vascular dementia without behavioral disturbance 07/12/2019 No Yes I10 Essential (primary) hypertension 07/12/2019 No Yes Inactive Problems Resolved Problems Electronic Signature(s) Signed: 07/12/2019 2:37:29 PM By: Lenda KelpStone III, Jezabel Lecker PA-C Previous Signature: 07/12/2019 2:00:19 PM Version By: Lenda KelpStone III, Miel Wisener PA-C Entered By: Lenda KelpStone III, Cem Kosman on 07/12/2019 14:37:28 April Powers, April Powers ([id number]) -------------------------------------------------------------------------------- Progress Note Details Patient Name: April Powers, April Powers Date of Service: 07/12/2019 1:15 PM Medical Record Number: [id number] Patient Account Number: [id number] Date of Birth/Sex: 1928-07-09 (85 y.o. F) Treating RN: Huel CoventryWoody, Kim Primary Care Provider: Orson EvaBOSWELL, CHELSA Other Clinician: Referring Provider: Helane GuntherMAYER, GREGORY Treating Provider/Extender: Linwood DibblesSTONE III, Alaija Ruble Weeks in Treatment: 0 Subjective Chief Complaint Information obtained from Patient Bilateral LE Ulcers History of Present Illness (HPI) 07/12/2019 on evaluation today patient appears for initial evaluation here in our clinic today concerning an issue that is been happening with her bilateral lower extremities and this began about 10 days ago. She does have bilateral lower extremity edema secondary to venous stasis. This does appear to be stage I lymphedema most likely. With that being said there is no signs of active  infection at this time which is good news. That was one concern of her sister was that the patient may have an infection and required antibiotic. The good news is that does not seem to be the case. With that being said I do think the patient needs good edema control at this point. She has previously had compression stockings but is been quite some time since she has worn them according to her sister who appears to be her primary caregiver and who is with her today. The patient has had a stroke and really does not talk much though fortunately other than the stroke does not appear to have a significant past medical history other than hypertension and dementia. No sharp debridement is good to be required today which is excellent news. With that being said I do believe that compression is good to be of utmost importance as far as getting these wounds to heal as well as keeping new areas from occurring. Patient History Information obtained from Patient. Allergies No Known Drug Allergies Family History Cancer - Siblings, Diabetes - Siblings, Heart Disease - Siblings,Father, Hypertension - Father,Siblings, No family history of Hereditary Spherocytosis, Kidney Disease, Lung Disease, Seizures, Stroke, Thyroid Problems, Tuberculosis. Social History Never smoker, Marital Status - Widowed, Alcohol Use - Never, Drug Use - No History, Caffeine Use - Rarely. Medical History Cardiovascular Patient has history of Hypertension, Myocardial Infarction - 5 years ago Denies history of Angina, Arrhythmia, Congestive Heart Failure, Coronary Artery Disease, Deep Vein Thrombosis, Hypotension, Peripheral Arterial Disease, Peripheral Venous Disease, Phlebitis, Vasculitis Genitourinary Denies history of End Stage Renal Disease Integumentary (Skin) Denies history of History of Burn, History of pressure wounds Musculoskeletal Patient has history of Osteoarthritis Denies history of Gout, Rheumatoid Arthritis,  Osteomyelitis Neurologic April Powers, April Powers ([id number]) Patient has history of Dementia Denies history of Neuropathy, Quadriplegia, Paraplegia, Seizure Disorder Medical And Surgical History Notes Neurologic CVA with anoxic brain injury Review of Systems (ROS) Constitutional Symptoms (General Health) Denies complaints or symptoms of Fatigue, Fever, Chills, Marked Weight Change. Eyes Denies complaints or symptoms of Dry Eyes, Vision Changes, Glasses / Contacts. Ear/Nose/Mouth/Throat Denies complaints or symptoms of Difficult clearing ears, Sinusitis. Hematologic/Lymphatic Denies complaints or symptoms of Bleeding / Clotting Disorders, Human Immunodeficiency Virus. Respiratory Denies complaints or symptoms of Chronic or frequent coughs, Shortness of Breath. Cardiovascular Complains or has symptoms of LE edema. Denies complaints or symptoms of Chest pain. Gastrointestinal Denies complaints  or symptoms of Frequent diarrhea, Nausea, Vomiting. Endocrine Denies complaints or symptoms of Hepatitis, Thyroid disease, Polydypsia (Excessive Thirst). Genitourinary Complains or has symptoms of Incontinence/dribbling. Denies complaints or symptoms of Kidney failure/ Dialysis. Immunological Denies complaints or symptoms of Hives, Itching. Integumentary (Skin) Complains or has symptoms of Wounds, Swelling. Denies complaints or symptoms of Bleeding or bruising tendency, Breakdown. Musculoskeletal Denies complaints or symptoms of Muscle Pain, Muscle Weakness. Neurologic Denies complaints or symptoms of Numbness/parasthesias, Focal/Weakness. Psychiatric Denies complaints or symptoms of Anxiety, Claustrophobia. Objective Constitutional sitting or standing blood pressure is within target range for patient.. pulse regular and within target range for patient.Marland Kitchen respirations regular, non-labored and within target range for patient.Marland Kitchen temperature within target range for patient.. Well- nourished and  well-hydrated in no acute distress. Vitals Time Taken: 1:31 PM, Height: 68 in, Source: Measured, Weight: 180 lbs, Source: Measured, BMI: 27.4, Temperature: 98.1 F, Pulse: 94 bpm, Respiratory Rate: 16 breaths/min, Blood Pressure: 130/82 mmHg. April Powers, April Powers ([id number]) Eyes conjunctiva clear no eyelid edema noted. pupils equal round and reactive to light and accommodation. Ears, Nose, Mouth, and Throat no gross abnormality of ear auricles or external auditory canals. normal hearing noted during conversation. mucus membranes moist. Respiratory normal breathing without difficulty. clear to auscultation bilaterally. Cardiovascular regular rate and rhythm with normal S1, S2. 2+ dorsalis pedis/posterior tibialis pulses. 2+ pitting edema of the bilateral lower extremities. Gastrointestinal (GI) soft, non-tender, non-distended, +BS. no ventral hernia noted. Musculoskeletal Patient unable to walk without assistance. no significant deformity or arthritic changes, no loss or range of motion, no clubbing. Psychiatric Patient is not able to cooperate in decision making regarding care. Patient is oriented to person only. patient is confused. General Notes: Upon inspection today patient's wound bed actually showed signs of good granulation on the bilateral lower extremities at the open locations. With that being said she does have a significant amount of edema as well as weeping noted at this point bilaterally. The wound on the left is significantly larger than the wound on the right. Integumentary (Hair, Skin) Wound #1 status is Open. Original cause of wound was Blister. The wound is located on the Left,Medial Lower Leg. The wound measures 4.7cm length x 5.9cm width x 0.1cm depth; 21.779cm^2 area and 2.178cm^3 volume. There is Fat Layer (Subcutaneous Tissue) Exposed exposed. There is no tunneling or undermining noted. There is a large amount of serous drainage noted. The wound margin is flat and  intact. There is large (67-100%) pink granulation within the wound bed. There is a small (1-33%) amount of necrotic tissue within the wound bed including Adherent Slough. Wound #2 status is Open. Original cause of wound was Blister. The wound is located on the Right,Anterior Lower Leg. The wound measures 0.5cm length x 0.4cm width x 0.1cm depth; 0.157cm^2 area and 0.016cm^3 volume. There is Fat Layer (Subcutaneous Tissue) Exposed exposed. There is no tunneling or undermining noted. There is a large amount of serous drainage noted. The wound margin is flat and intact. There is medium (34-66%) pink granulation within the wound bed. There is a medium (34-66%) amount of necrotic tissue within the wound bed including Adherent Slough. Assessment Active Problems ICD-10 Lymphedema, not elsewhere classified Venous insufficiency (chronic) (peripheral) Non-pressure chronic ulcer of other part of left lower leg with fat layer exposed Non-pressure chronic ulcer of other part of right lower leg with fat layer exposed Vascular dementia without behavioral disturbance Essential (primary) hypertension April Powers, April Powers ([id number]) Plan Wound Cleansing: Wound #1 Left,Medial Lower Leg: No tub bath. Other: -  Do not get dressings wet. Wound #2 Right,Anterior Lower Leg: No tub bath. Other: - Do not get dressings wet. Anesthetic (add to Medication List): Wound #1 Left,Medial Lower Leg: Topical Lidocaine 4% cream applied to wound bed prior to debridement (In Clinic Only). Wound #2 Right,Anterior Lower Leg: Topical Lidocaine 4% cream applied to wound bed prior to debridement (In Clinic Only). Primary Wound Dressing: Wound #1 Left,Medial Lower Leg: Silver Alginate Wound #2 Right,Anterior Lower Leg: Silver Alginate Secondary Dressing: Wound #1 Left,Medial Lower Leg: Other - xtrasorb Wound #2 Right,Anterior Lower Leg: Other - xtrasorb Dressing Change Frequency: Wound #1 Left,Medial Lower Leg: Change  dressing every week Wound #2 Right,Anterior Lower Leg: Change dressing every week Follow-up Appointments: Wound #1 Left,Medial Lower Leg: Return Appointment in 1 week. Nurse Visit as needed Wound #2 Right,Anterior Lower Leg: Return Appointment in 1 week. Nurse Visit as needed Edema Control: Wound #1 Left,Medial Lower Leg: 3 Layer Compression System - Bilateral Elevate legs to the level of the heart and pump ankles as often as possible Wound #2 Right,Anterior Lower Leg: 3 Layer Compression System - Bilateral Elevate legs to the level of the heart and pump ankles as often as possible 1 I would recommend currently that we go ahead and initiate a silver alginate dressing for the patient she is in agreement the plan. 2. I am also going to suggest that we go ahead and utilize a 3 layer compression wrap to help with edema control bilaterally that I think will be very beneficial for her. 3. We will also consider ordering compression stockings for her from [facility] next week I would await to get some of the edema down we will also consider for nurse visit on either Wednesday or Thursday in order to get things switched out to make sure the wrap is not slipping down. April Powers, April Powers ([id number]) We will see patient back for reevaluation in 1 week here in the clinic. If anything worsens or changes patient will contact our office for additional recommendations. Electronic Signature(s) Signed: 07/12/2019 2:43:03 PM By: Lenda Kelp PA-C Entered By: Lenda Kelp on 07/12/2019 14:43:03 April Powers ([id number]) -------------------------------------------------------------------------------- ROS/PFSH Details Patient Name: April Powers Date of Service: 07/12/2019 1:15 PM Medical Record Number: [id number] Patient Account Number: [id number] Date of Birth/Sex: 07-14-1928 (85 y.o. F) Treating RN: Curtis Sites Primary Care Provider: Orson Eva Other Clinician: Referring Provider:  Helane Gunther Treating Provider/Extender: Linwood Dibbles, Pamelia Botto Weeks in Treatment: 0 Information Obtained From Patient Constitutional Symptoms (General Health) Complaints and Symptoms: Negative for: Fatigue; Fever; Chills; Marked Weight Change Eyes Complaints and Symptoms: Negative for: Dry Eyes; Vision Changes; Glasses / Contacts Ear/Nose/Mouth/Throat Complaints and Symptoms: Negative for: Difficult clearing ears; Sinusitis Hematologic/Lymphatic Complaints and Symptoms: Negative for: Bleeding / Clotting Disorders; Human Immunodeficiency Virus Respiratory Complaints and Symptoms: Negative for: Chronic or frequent coughs; Shortness of Breath Cardiovascular Complaints and Symptoms: Positive for: LE edema Negative for: Chest pain Medical History: Positive for: Hypertension; Myocardial Infarction - 5 years ago Negative for: Angina; Arrhythmia; Congestive Heart Failure; Coronary Artery Disease; Deep Vein Thrombosis; Hypotension; Peripheral Arterial Disease; Peripheral Venous Disease; Phlebitis; Vasculitis Gastrointestinal Complaints and Symptoms: Negative for: Frequent diarrhea; Nausea; Vomiting Endocrine Complaints and Symptoms: Negative for: Hepatitis; Thyroid disease; Polydypsia (Excessive Thirst) Genitourinary April Powers, April Powers ([id number]) Complaints and Symptoms: Positive for: Incontinence/dribbling Negative for: Kidney failure/ Dialysis Medical History: Negative for: End Stage Renal Disease Immunological Complaints and Symptoms: Negative for: Hives; Itching Integumentary (Skin) Complaints and Symptoms: Positive for: Wounds; Swelling Negative for: Bleeding or bruising tendency;  Breakdown Medical History: Negative for: History of Burn; History of pressure wounds Musculoskeletal Complaints and Symptoms: Negative for: Muscle Pain; Muscle Weakness Medical History: Positive for: Osteoarthritis Negative for: Gout; Rheumatoid Arthritis; Osteomyelitis Neurologic Complaints  and Symptoms: Negative for: Numbness/parasthesias; Focal/Weakness Medical History: Positive for: Dementia Negative for: Neuropathy; Quadriplegia; Paraplegia; Seizure Disorder Past Medical History Notes: CVA with anoxic brain injury Psychiatric Complaints and Symptoms: Negative for: Anxiety; Claustrophobia Oncologic Immunizations Pneumococcal Vaccine: Received Pneumococcal Vaccination: No Implantable Devices None Family and Social History Cancer: Yes - Siblings; Diabetes: Yes - Siblings; Heart Disease: Yes - Siblings,Father; Hereditary Spherocytosis: No; Hypertension: Yes - Father,Siblings; Kidney Disease: No; Lung Disease: No; Seizures: No; Stroke: No; Thyroid Problems: No; Tuberculosis: No; Never smoker; Marital Status - Widowed; Alcohol Use: Never; Drug Use: No History; Caffeine Use: April Powers, April Powers ([id number]) Rarely; Financial Concerns: No; Food, Clothing or Shelter Needs: No; Support System Lacking: No; Transportation Concerns: No Electronic Signature(s) Signed: 07/12/2019 3:56:20 PM By: Curtis Sites Signed: 07/12/2019 4:22:49 PM By: Lenda Kelp PA-C Entered By: Curtis Sites on 07/12/2019 13:37:11 April Powers ([id number]) -------------------------------------------------------------------------------- SuperBill Details Patient Name: April Powers Date of Service: 07/12/2019 Medical Record Number: [id number] Patient Account Number: [id number] Date of Birth/Sex: [birth year] (85 y.o. F) Treating RN: Huel Coventry Primary Care Provider: Orson Eva Other Clinician: Referring Provider: Helane Gunther Treating Provider/Extender: Linwood Dibbles, Griselle Rufer Weeks in Treatment: 0 Diagnosis Coding ICD-10 Codes Code Description I89.0 Lymphedema, not elsewhere classified I87.2 Venous insufficiency (chronic) (peripheral) L97.822 Non-pressure chronic ulcer of other part of left lower leg with fat layer exposed L97.812 Non-pressure chronic ulcer of other part of right lower leg with  fat layer exposed F01.50 Vascular dementia without behavioral disturbance I10 Essential (primary) hypertension Facility Procedures CPT4: Description Modifier Quantity Code [id number] - WOUND CARE VISIT-LEV 3 EST PT 1 CPT4: [id number] BILATERAL: Application of multi-layer venous compression system; leg (below 1 knee), including ankle and foot. Physician Procedures CPT4 Code Description: [id number] WC PHYS LEVEL 3 o NEW PT ICD-10 Diagnosis Description I89.0 Lymphedema, not elsewhere classified I87.2 Venous insufficiency (chronic) (peripheral) L97.822 Non-pressure chronic ulcer of other part of left lower leg wi  L97.812 Non-pressure chronic ulcer of other part of right lower leg w Modifier: th fat layer expose ith fat layer expos Quantity: 1 d ed Electronic Signature(s) Signed: 07/12/2019 4:21:24 PM By: Elliot Gurney, BSN, RN, CWS, Kim RN, BSN Signed: 07/12/2019 4:22:49 PM By: Lenda Kelp PA-C Previous Signature: 07/12/2019 2:43:22 PM Version By: Lenda Kelp PA-C Entered By: Elliot Gurney, BSN, RN, CWS, Kim on 07/12/2019 02:72:53

## 2019-07-15 ENCOUNTER — Other Ambulatory Visit: Payer: Self-pay

## 2019-07-15 DIAGNOSIS — L97812 Non-pressure chronic ulcer of other part of right lower leg with fat layer exposed: Secondary | ICD-10-CM | POA: Diagnosis not present

## 2019-07-15 NOTE — Progress Notes (Signed)
April Powers, April Powers ([id number]) Visit Report for 07/15/2019 Arrival Information Details Patient Name: April Powers Date of Service: 07/15/2019 2:00 PM Medical Record Number: [id number] Patient Account Number: [id number] Date of Birth/Sex: 1928/07/23 (85 y.o. F) Treating RN: Montey Hora Primary Care Caelen Reierson: Evern Bio Other Clinician: Referring Bren Steers: Evern Bio Treating Reonna Finlayson/Extender: Melburn Hake, HOYT Weeks in Treatment: 0 Visit Information History Since Last Visit Added or deleted any medications: No Patient Arrived: Cane Any new allergies or adverse reactions: No Arrival Time: 14:12 Had a fall or experienced change in No Accompanied By: neice activities of daily living that may affect Transfer Assistance: None risk of falls: Patient Identification Verified: Yes Signs or symptoms of abuse/neglect since last visito No Secondary Verification Process Yes Hospitalized since last visit: No Completed: Implantable device outside of the clinic excluding No Patient Has Alerts: Yes cellular tissue based products placed in the center Patient Alerts: Patient on Blood since last visit: Thinner Has Dressing in Place as Prescribed: Yes Eliquis Has Compression in Place as Prescribed: Yes Pain Present Now: No Electronic Signature(s) Signed: 07/15/2019 4:46:01 PM By: Montey Hora Entered By: Montey Hora on 07/15/2019 14:22:22 April Powers ([id number]) -------------------------------------------------------------------------------- Compression Therapy Details Patient Name: April Powers Date of Service: 07/15/2019 2:00 PM Medical Record Number: [id number] Patient Account Number: [id number] Date of Birth/Sex: 02-21-1928 (85 y.o. F) Treating RN: Montey Hora Primary Care Intisar Claudio: Evern Bio Other Clinician: Referring Cy Bresee: Evern Bio Treating Ziad Maye/Extender: STONE III, HOYT Weeks in Treatment: 0 Compression Therapy Performed for Wound  Assessment: Wound #1 Left,Medial Lower Leg Performed By: Clinician Montey Hora, RN Compression Type: Three Layer Pre Treatment ABI: 1 Electronic Signature(s) Signed: 07/15/2019 4:46:01 PM By: Montey Hora Entered By: Montey Hora on 07/15/2019 14:40:00 April Powers ([id number]) -------------------------------------------------------------------------------- Compression Therapy Details Patient Name: April Powers Date of Service: 07/15/2019 2:00 PM Medical Record Number: [id number] Patient Account Number: [id number] Date of Birth/Sex: 14-Apr-1928 (85 y.o. F) Treating RN: Montey Hora Primary Care Earlyn Sylvan: Evern Bio Other Clinician: Referring Viviano Bir: Evern Bio Treating Nyeemah Jennette/Extender: STONE III, HOYT Weeks in Treatment: 0 Compression Therapy Performed for Wound Assessment: Wound #2 Right,Anterior Lower Leg Performed By: Clinician Montey Hora, RN Compression Type: Three Layer Pre Treatment ABI: 1 Electronic Signature(s) Signed: 07/15/2019 4:46:01 PM By: Montey Hora Entered By: Montey Hora on 07/15/2019 14:40:01 April Powers ([id number]) -------------------------------------------------------------------------------- Encounter Discharge Information Details Patient Name: April Powers Date of Service: 07/15/2019 2:00 PM Medical Record Number: [id number] Patient Account Number: [id number] Date of Birth/Sex: 18-Feb-1928 (85 y.o. F) Treating RN: Montey Hora Primary Care Cornell Gaber: Evern Bio Other Clinician: Referring Mckena Chern: Evern Bio Treating Dyland Panuco/Extender: Melburn Hake, HOYT Weeks in Treatment: 0 Encounter Discharge Information Items Discharge Condition: Stable Ambulatory Status: Cane Discharge Destination: Home Transportation: Private Auto Accompanied By: Saddie Benders Schedule Follow-up Appointment: Yes Clinical Summary of Care: Electronic Signature(s) Signed: 07/15/2019 2:49:16 PM By: Montey Hora Entered By: Montey Hora on  07/15/2019 14:49:16 April Powers ([id number]) -------------------------------------------------------------------------------- Wound Assessment Details Patient Name: April Powers Date of Service: 07/15/2019 2:00 PM Medical Record Number: [id number] Patient Account Number: [id number] Date of Birth/Sex: 23-Feb-1928 (85 y.o. F) Treating RN: Montey Hora Primary Care Aarion Metzgar: Evern Bio Other Clinician: Referring Joel Cowin: Evern Bio Treating Alva Kuenzel/Extender: STONE III, HOYT Weeks in Treatment: 0 Wound Status Wound Number: 1 Primary Venous Leg Ulcer Etiology: Wound Location: Left Lower Leg - Medial Wound Status: Open Wounding Event: Blister Comorbid Hypertension, Myocardial Infarction, Date Acquired: 07/02/2019 History: Osteoarthritis, Dementia Weeks Of Treatment: 0 Clustered Wound: No Photos Wound Measurements Length: (cm) 4.7 Width: (cm) 5.9 Depth: (cm) 0.1 Area: (  cm) 21.779 Volume: (cm) 2.178 % Reduction in Area: 0% % Reduction in Volume: 0% Epithelialization: None Tunneling: No Undermining: No Wound Description Full Thickness Without Exposed Support Foul Od Classification: Structures Slough/ Wound Margin: Flat and Intact Exudate Large Amount: Exudate Type: Serous Exudate Color: amber or After Cleansing: No Fibrino Yes Wound Bed Granulation Amount: Large (67-100%) Exposed Structure Granulation Quality: Pink Fascia Exposed: No Necrotic Amount: Small (1-33%) Fat Layer (Subcutaneous Tissue) Exposed: Yes Necrotic Quality: Adherent Slough Tendon Exposed: No Muscle Exposed: No Joint Exposed: No Bone Exposed: No Powers, April ([id number]) Treatment Notes Wound #1 (Left, Medial Lower Leg) Notes Silver Cell, X-sorb, 3L(B), unna to anchor Electronic Signature(s) Signed: 07/15/2019 4:46:01 PM By: Curtis Sites Entered By: Curtis Sites on 07/15/2019 14:23:24 April Powers  ([id number]) -------------------------------------------------------------------------------- Wound Assessment Details Patient Name: April Powers Date of Service: 07/15/2019 2:00 PM Medical Record Number: [id number] Patient Account Number: [id number] Date of Birth/Sex: [birth year] (85 y.o. F) Treating RN: Curtis Sites Primary Care Marilena Trevathan: Orson Eva Other Clinician: Referring Yola Paradiso: Orson Eva Treating Freddi Forster/Extender: STONE III, HOYT Weeks in Treatment: 0 Wound Status Wound Number: 2 Primary Venous Leg Ulcer Etiology: Wound Location: Right Lower Leg - Anterior Wound Status: Open Wounding Event: Blister Comorbid Hypertension, Myocardial Infarction, Date Acquired: 07/02/2019 History: Osteoarthritis, Dementia Weeks Of Treatment: 0 Clustered Wound: No Photos Wound Measurements Length: (cm) 0.5 Width: (cm) 0.4 Depth: (cm) 0.1 Area: (cm) 0.157 Volume: (cm) 0.016 % Reduction in Area: 0% % Reduction in Volume: 0% Epithelialization: None Tunneling: No Undermining: No Wound Description Full Thickness Without Exposed Support Foul Od Classification: Structures Slough/ Wound Margin: Flat and Intact Exudate Large Amount: Exudate Type: Serous Exudate Color: amber or After Cleansing: No Fibrino Yes Wound Bed Granulation Amount: Medium (34-66%) Exposed Structure Granulation Quality: Pink Fascia Exposed: No Necrotic Amount: Medium (34-66%) Fat Layer (Subcutaneous Tissue) Exposed: Yes Necrotic Quality: Adherent Slough Tendon Exposed: No Muscle Exposed: No Joint Exposed: No Bone Exposed: No Carbine, Shannelle ([id number]) Treatment Notes Wound #2 (Right, Anterior Lower Leg) Notes Silver Cell, X-sorb, 3L(B), unna to anchor Electronic Signature(s) Signed: 07/15/2019 4:46:01 PM By: Curtis Sites Entered By: Curtis Sites on 07/15/2019 14:23:44

## 2019-07-20 ENCOUNTER — Other Ambulatory Visit: Payer: Self-pay

## 2019-07-20 ENCOUNTER — Encounter: Payer: Medicare HMO | Admitting: Physician Assistant

## 2019-07-20 DIAGNOSIS — L97812 Non-pressure chronic ulcer of other part of right lower leg with fat layer exposed: Secondary | ICD-10-CM | POA: Diagnosis not present

## 2019-07-20 NOTE — Progress Notes (Addendum)
April Powers, April Powers ([id number]) Visit Report for 07/20/2019 Chief Complaint Document Details Patient Name: April Powers, April Powers Date of Service: 07/20/2019 2:00 PM Medical Record Number: [id number] Patient Account Number: [id number] Date of Birth/Sex: Aug 11, 1928 (85 y.o. F) Treating RN: Montey Hora Primary Care Provider: Evern Bio Other Clinician: Referring Provider: Evern Bio Treating Provider/Extender: Melburn Hake, HOYT Weeks in Treatment: 1 Information Obtained from: Patient Chief Complaint Bilateral LE Ulcers Electronic Signature(s) Signed: 07/20/2019 2:29:26 PM By: Worthy Keeler PA-C Entered By: Worthy Keeler on 07/20/2019 14:29:26 April Powers ([id number]) -------------------------------------------------------------------------------- HPI Details Patient Name: April Powers Date of Service: 07/20/2019 2:00 PM Medical Record Number: [id number] Patient Account Number: [id number] Date of Birth/Sex: 03-Sep-1928 (85 y.o. F) Treating RN: Montey Hora Primary Care Provider: Evern Bio Other Clinician: Referring Provider: Evern Bio Treating Provider/Extender: Melburn Hake, HOYT Weeks in Treatment: 1 History of Present Illness HPI Description: 07/12/2019 on evaluation today patient appears for initial evaluation here in our clinic today concerning an issue that is been happening with her bilateral lower extremities and this began about 10 days ago. She does have bilateral lower extremity edema secondary to venous stasis. This does appear to be stage I lymphedema most likely. With that being said there is no signs of active infection at this time which is good news. That was one concern of her sister was that the patient may have an infection and required antibiotic. The good news is that does not seem to be the case. With that being said I do think the patient needs good edema control at this point. She has previously had compression stockings but is been quite some  time since she has worn them according to her sister who appears to be her primary caregiver and who is with her today. The patient has had a stroke and really does not talk much though fortunately other than the stroke does not appear to have a significant past medical history other than hypertension and dementia. No sharp debridement is good to be required today which is excellent news. With that being said I do believe that compression is good to be of utmost importance as far as getting these wounds to heal as well as keeping new areas from occurring. 07/20/2019 on evaluation today patient presents for follow-up concerning ongoing issues with her bilateral lower extremities. Fortunately there is no signs of active infection at this time. With that being said she still has several open wounds that are draining quite significantly at this point. There is no signs of systemic infection. No fevers, chills, nausea, vomiting, or diarrhea. The patient does have significant dementia and has very hard time getting around here in the clinic today I did advise her niece who is with her at this point that in the future she is going to need to use a wheelchair when here at the clinic in order to prevent any additional issues such as injury while she is here on the premises. I understand I do want her to try to walk but I think she needs to do so with a walker and in her more comfortable home environment where again she is more familiar with the surroundings. Patient's family member voiced understanding. Electronic Signature(s) Signed: 07/21/2019 6:12:50 PM By: Worthy Keeler PA-C Entered By: Worthy Keeler on 07/21/2019 18:07:12 April Powers ([id number]) -------------------------------------------------------------------------------- Physical Exam Details Patient Name: April Powers Date of Service: 07/20/2019 2:00 PM Medical Record Number: [id number] Patient Account Number: [id number] Date of  Birth/Sex: 04-04-1928 (85 y.o. F) Treating RN:  Curtis Sites Primary Care Provider: Orson Eva Other Clinician: Referring Provider: Orson Eva Treating Provider/Extender: STONE III, HOYT Weeks in Treatment: 1 Constitutional Well-nourished and well-hydrated in no acute distress. Respiratory normal breathing without difficulty. clear to auscultation bilaterally. Cardiovascular regular rate and rhythm with normal S1, S2. Psychiatric Patient is not able to cooperate in decision making regarding care. Patient has dementia. patient is confused. Notes Patient's wound bed currently did not require any sharp debridement she does continue to have several open areas that are draining at this time unfortunately. Fortunately there is no signs of active infection which is good news. The patient does have some discomfort it seems when I wipe the area and she did seem to respond but again this did not appear to be severe. Electronic Signature(s) Signed: 07/21/2019 6:12:50 PM By: Lenda Kelp PA-C Entered By: Lenda Kelp on 07/21/2019 18:07:39 April Powers ([id number]) -------------------------------------------------------------------------------- Physician Orders Details Patient Name: April Powers Date of Service: 07/20/2019 2:00 PM Medical Record Number: [id number] Patient Account Number: [id number] Date of Birth/Sex: 11/03/1927 (85 y.o. 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 - Please do not get your wraps wet o No tub bath. Wound #2 Right,Anterior Lower Leg o May shower with protection. - Please do not get your wraps wet o No tub bath. Wound #3 Right,Proximal,Medial,Anterior Lower Leg o May shower with protection. - Please do not get your wraps wet o No tub bath. Anesthetic (add to Medication List) Wound #1 Left,Medial Lower Leg o Topical Lidocaine 4% cream applied to wound bed prior to debridement (In Clinic Only). Wound #2 Right,Anterior Lower Leg o Topical Lidocaine 4% cream applied to wound bed prior to debridement (In Clinic Only). Wound #3 Right,Proximal,Medial,Anterior Lower Leg o Topical Lidocaine 4% cream applied to wound bed prior to debridement (In Clinic Only). Primary Wound Dressing Wound #1 Left,Medial Lower Leg o XtraSorb Wound #2 Right,Anterior Lower Leg o XtraSorb Wound #3 Right,Proximal,Medial,Anterior Lower Leg o XtraSorb [place], Dyanne ([id number]) Secondary Dressing Wound #1 Left,Medial Lower Leg o ABD pad Wound #2 Right,Anterior Lower Leg o ABD pad Wound #3 Right,Proximal,Medial,Anterior Lower Leg o ABD pad Dressing Change Frequency Wound #1 Left,Medial Lower Leg o Change Dressing Monday, Wednesday, Friday Wound #2 Right,Anterior Lower Leg o Change Dressing Monday, Wednesday, Friday Wound #3 Right,Proximal,Medial,Anterior Lower Leg o Change Dressing Monday, Wednesday, Friday Follow-up Appointments Wound #1 Left,Medial Lower Leg o Return Appointment in 1 week. o Nurse Visit as needed Wound #2 Right,Anterior Lower Leg o Return Appointment in 1 week. o Nurse Visit as needed Wound #3 Right,Proximal,Medial,Anterior Lower Leg o Return Appointment in 1 week. o Nurse Visit as needed Edema  Control Wound #1 Left,Medial Lower Leg o 3 Layer Compression System - Bilateral - may anchor with unna o Elevate legs to the level of the heart and pump ankles as often as possible Wound #2 Right,Anterior Lower Leg o 3 Layer Compression System - Bilateral - may anchor with unna o Elevate legs to the level  of the heart and pump ankles as often as possible Wound #3 Right,Proximal,Medial,Anterior Lower Leg o 3 Layer Compression System - Bilateral - may anchor with unna o Elevate legs to the level of the heart and pump ankles as often as possible Home Health Wound #1 Left,Medial Lower Leg o Initiate Home Health for Skilled Nursing o Home Health Nurse may visit PRN to address patientos wound care needs. o FACE TO FACE ENCOUNTER: MEDICARE and MEDICAID PATIENTS: I certify that this patient is under my care and that I had a face-to-face encounter that meets the physician face-to-face encounter requirements with this patient on this date. The encounter with the patient was in whole or in part for the following MEDICAL CONDITION: (primary reason for Home Healthcare) MEDICAL NECESSITY: I certify, that based on my findings, NURSING services are a medically necessary home health service. HOME BOUND STATUS: I certify that my clinical findings support that this patient is homebound (i.e., Due to illness or injury, pt requires aid of EDESSA, JAKUBOWICZ ([id number]) supportive devices such as crutches, cane, wheelchairs, walkers, the use of special transportation or the assistance of another person to leave their place of residence. There is a normal inability to leave the home and doing so requires considerable and taxing effort. Other absences are for medical reasons / religious services and are infrequent or of short duration when for other reasons). o If current dressing causes regression in wound condition, may D/C ordered dressing product/s and apply Normal Saline Moist Dressing daily  until next Wound Healing Center / Other MD appointment. Notify Wound Healing Center of regression in wound condition at [phone]. o Please direct any NON-WOUND related issues/requests for orders to patient's Primary Care Physician Wound #2 Right,Anterior Lower Leg o Initiate Home Health for Skilled Nursing o Home Health Nurse may visit PRN to address patientos wound care needs. o FACE TO FACE ENCOUNTER: MEDICARE and MEDICAID PATIENTS: I certify that this patient is under my care and that I had a face-to-face encounter that meets the physician face-to-face encounter requirements with this patient on this date. The encounter with the patient was in whole or in part for the following MEDICAL CONDITION: (primary reason for Home Healthcare) MEDICAL NECESSITY: I certify, that based on my findings, NURSING services are a medically necessary home health service. HOME BOUND STATUS: I certify that my clinical findings support that this patient is homebound (i.e., Due to illness or injury, pt requires aid of supportive devices such as crutches, cane, wheelchairs, walkers, the use of special transportation or the assistance of another person to leave their place of residence. There is a normal inability to leave the home and doing so requires considerable and taxing effort. Other absences are for medical reasons / religious services and are infrequent or of short duration when for other reasons). o If current dressing causes regression in wound condition, may D/C ordered dressing product/s and apply Normal Saline Moist Dressing daily until next Wound Healing Center / Other MD appointment. Notify Wound Healing Center of regression in wound condition at [phone]. o Please direct any NON-WOUND related issues/requests for orders to patient's Primary Care Physician Wound #3 Right,Proximal,Medial,Anterior Lower Leg o Initiate Home Health for Skilled Nursing o Home Health Nurse may visit  PRN to address patientos wound care needs. o FACE TO FACE ENCOUNTER: MEDICARE and MEDICAID PATIENTS: I certify that this patient is under my care and that I had a face-to-face encounter that meets the physician face-to-face encounter requirements with this patient on this  date. The encounter with the patient was in whole or in part for the following MEDICAL CONDITION: (primary reason for Home Healthcare) MEDICAL NECESSITY: I certify, that based on my findings, NURSING services are a medically necessary home health service. HOME BOUND STATUS: I certify that my clinical findings support that this patient is homebound (i.e., Due to illness or injury, pt requires aid of supportive devices such as crutches, cane, wheelchairs, walkers, the use of special transportation or the assistance of another person to leave their place of residence. There is a normal inability to leave the home and doing so requires considerable and taxing effort. Other absences are for medical reasons / religious services and are infrequent or of short duration when for other reasons). o If current dressing causes regression in wound condition, may D/C ordered dressing product/s and apply Normal Saline Moist Dressing daily until next Wound Healing Center / Other MD appointment. Notify Wound Healing Center of regression in wound condition at [phone]. o Please direct any NON-WOUND related issues/requests for orders to patient's Primary Care Physician Electronic Signature(s) Signed: 07/20/2019 5:32:28 PM By: Curtis Sites Signed: 07/21/2019 6:12:50 PM By: Lenda Kelp PA-C Entered By: Curtis Sites on 07/20/2019 15:51:54 April Powers ([id number]) -------------------------------------------------------------------------------- Problem List Details Patient Name: April Powers Date of Service: 07/20/2019 2:00 PM Medical Record Number: [id number] Patient Account Number: [id number] Date of Birth/Sex: 1928-09-27  (85 y.o. F) Treating RN: Curtis Sites Primary Care Provider: Orson Eva Other Clinician: Referring Provider: Orson Eva Treating Provider/Extender: Linwood Dibbles, HOYT Weeks in Treatment: 1 Active Problems ICD-10 Evaluated Encounter Code Description Active Date Today Diagnosis I89.0 Lymphedema, not elsewhere classified 07/12/2019 No Yes I87.2 Venous insufficiency (chronic) (peripheral) 07/12/2019 No Yes L97.822 Non-pressure chronic ulcer of other part of left lower leg with 07/12/2019 No Yes fat layer exposed L97.812 Non-pressure chronic ulcer of other part of right lower leg 07/12/2019 No Yes with fat layer exposed F01.50 Vascular dementia without behavioral disturbance 07/12/2019 No Yes I10 Essential (primary) hypertension 07/12/2019 No Yes Inactive Problems Resolved Problems Electronic Signature(s) Signed: 07/20/2019 2:29:21 PM By: Lenda Kelp PA-C Entered By: Lenda Kelp on 07/20/2019 14:29:21 April Powers ([id number]) -------------------------------------------------------------------------------- Progress Note Details Patient Name: April Powers Date of Service: 07/20/2019 2:00 PM Medical Record Number: [id number] Patient Account Number: [id number] Date of Birth/Sex: 1928-08-16 (85 y.o. F) Treating RN: Curtis Sites Primary Care Provider: Orson Eva Other Clinician: Referring Provider: Orson Eva Treating Provider/Extender: Linwood Dibbles, HOYT Weeks in Treatment: 1 Subjective Chief Complaint Information obtained from Patient Bilateral LE Ulcers History of Present Illness (HPI) 07/12/2019 on evaluation today patient appears for initial evaluation here in our clinic today concerning an issue that is been happening with her bilateral lower extremities and this began about 10 days ago. She does have bilateral lower extremity edema secondary to venous stasis. This does appear to be stage I lymphedema most likely. With that being said there is no signs of  active infection at this time which is good news. That was one concern of her sister was that the patient may have an infection and required antibiotic. The good news is that does not seem to be the case. With that being said I do think the patient needs good edema control at this point. She has previously had compression stockings but is been quite some time since she has worn them according to her sister who appears to be her primary caregiver and who is with her today. The patient has had a stroke and really does not  talk much though fortunately other than the stroke does not appear to have a significant past medical history other than hypertension and dementia. No sharp debridement is good to be required today which is excellent news. With that being said I do believe that compression is good to be of utmost importance as far as getting these wounds to heal as well as keeping new areas from occurring. 07/20/2019 on evaluation today patient presents for follow-up concerning ongoing issues with her bilateral lower extremities. Fortunately there is no signs of active infection at this time. With that being said she still has several open wounds that are draining quite significantly at this point. There is no signs of systemic infection. No fevers, chills, nausea, vomiting, or diarrhea. The patient does have significant dementia and has very hard time getting around here in the clinic today I did advise her niece who is with her at this point that in the future she is going to need to use a wheelchair when here at the clinic in order to prevent any additional issues such as injury while she is here on the premises. I understand I do want her to try to walk but I think she needs to do so with a walker and in her more comfortable home environment where again she is more familiar with the surroundings. Patient's family member voiced understanding. Patient History Information obtained from  Patient. Family History Cancer - Siblings, Diabetes - Siblings, Heart Disease - Siblings,Father, Hypertension - Father,Siblings, No family history of Hereditary Spherocytosis, Kidney Disease, Lung Disease, Seizures, Stroke, Thyroid Problems, Tuberculosis. Social History Never smoker, Marital Status - Widowed, Alcohol Use - Never, Drug Use - No History, Caffeine Use - Rarely. Medical History Cardiovascular Patient has history of Hypertension, Myocardial Infarction - 5 years ago Denies history of Angina, Arrhythmia, Congestive Heart Failure, Coronary Artery Disease, Deep Vein Thrombosis, Hypotension, Peripheral Arterial Disease, Peripheral Venous Disease, Phlebitis, Vasculitis Genitourinary Denies history of End Stage Renal Disease Integumentary (Skin) April Powers ([id number]) Denies history of History of Burn, History of pressure wounds Musculoskeletal Patient has history of Osteoarthritis Denies history of Gout, Rheumatoid Arthritis, Osteomyelitis Neurologic Patient has history of Dementia Denies history of Neuropathy, Quadriplegia, Paraplegia, Seizure Disorder Medical And Surgical History Notes Neurologic CVA with anoxic brain injury Review of Systems (ROS) Constitutional Symptoms (General Health) Denies complaints or symptoms of Fatigue, Fever, Chills, Marked Weight Change. Respiratory Denies complaints or symptoms of Chronic or frequent coughs, Shortness of Breath. Cardiovascular Denies complaints or symptoms of Chest pain, LE edema. Psychiatric Denies complaints or symptoms of Anxiety, Claustrophobia. Objective Constitutional Well-nourished and well-hydrated in no acute distress. Vitals Time Taken: 2:47 PM, Height: 68 in, Weight: 180 lbs, BMI: 27.4, Temperature: 99.5 F, Pulse: 108 bpm, Respiratory Rate: 16 breaths/min, Blood Pressure: 105/74 mmHg. Respiratory normal breathing without difficulty. clear to auscultation bilaterally. Cardiovascular regular rate and  rhythm with normal S1, S2. Psychiatric Patient is not able to cooperate in decision making regarding care. Patient has dementia. patient is confused. General Notes: Patient's wound bed currently did not require any sharp debridement she does continue to have several open areas that are draining at this time unfortunately. Fortunately there is no signs of active infection which is good news. The patient does have some discomfort it seems when I wipe the area and she did seem to respond but again this did not appear to be severe. Integumentary (Hair, Skin) Wound #1 status is Open. Original cause of wound was Blister. The wound is located on the  Left,Medial Lower Leg. The wound measures 4.9cm length x 5.3cm width x 0.1cm depth; 20.397cm^2 area and 2.04cm^3 volume. Wound #2 status is Open. Original cause of wound was Blister. The wound is located on the Right,Anterior Lower Leg. The April Powers, April Powers ([id number]) wound measures 1.2cm length x 0.5cm width x 0.1cm depth; 0.471cm^2 area and 0.047cm^3 volume. Wound #3 status is Open. Original cause of wound was Gradually Appeared. The wound is located on the Right,Proximal,Medial,Anterior Lower Leg. The wound measures 0.5cm length x 0.3cm width x 0.1cm depth; 0.118cm^2 area and 0.012cm^3 volume. Assessment Active Problems ICD-10 Lymphedema, not elsewhere classified Venous insufficiency (chronic) (peripheral) Non-pressure chronic ulcer of other part of left lower leg with fat layer exposed Non-pressure chronic ulcer of other part of right lower leg with fat layer exposed Vascular dementia without behavioral disturbance Essential (primary) hypertension Procedures Wound #1 Pre-procedure diagnosis of Wound #1 is a Venous Leg Ulcer located on the Left,Medial Lower Leg . There was a Three Layer Compression Therapy Procedure with a pre-treatment ABI of 1 by Curtis Sitesorthy, Joanna, RN. Post procedure Diagnosis Wound #1: Same as Pre-Procedure Wound  #2 Pre-procedure diagnosis of Wound #2 is a Venous Leg Ulcer located on the Right,Anterior Lower Leg . There was a Three Layer Compression Therapy Procedure with a pre-treatment ABI of 1 by Curtis Sitesorthy, Joanna, RN. Post procedure Diagnosis Wound #2: Same as Pre-Procedure Wound #3 Pre-procedure diagnosis of Wound #3 is a Venous Leg Ulcer located on the Right,Proximal,Medial,Anterior Lower Leg . There was a Three Layer Compression Therapy Procedure with a pre-treatment ABI of 1 by Curtis Sitesorthy, Joanna, RN. Post procedure Diagnosis Wound #3: Same as Pre-Procedure Plan Wound Cleansing: Wound #1 Left,Medial Lower Leg: May shower with protection. - Please do not get your wraps wet No tub bath. Wound #2 Right,Anterior Lower Leg: May shower with protection. - Please do not get your wraps wet April Powers, April Powers ([id number]) No tub bath. Wound #3 Right,Proximal,Medial,Anterior Lower Leg: May shower with protection. - Please do not get your wraps wet No tub bath. Anesthetic (add to Medication List): Wound #1 Left,Medial Lower Leg: Topical Lidocaine 4% cream applied to wound bed prior to debridement (In Clinic Only). Wound #2 Right,Anterior Lower Leg: Topical Lidocaine 4% cream applied to wound bed prior to debridement (In Clinic Only). Wound #3 Right,Proximal,Medial,Anterior Lower Leg: Topical Lidocaine 4% cream applied to wound bed prior to debridement (In Clinic Only). Primary Wound Dressing: Wound #1 Left,Medial Lower Leg: XtraSorb Wound #2 Right,Anterior Lower Leg: XtraSorb Wound #3 Right,Proximal,Medial,Anterior Lower Leg: XtraSorb Secondary Dressing: Wound #1 Left,Medial Lower Leg: ABD pad Wound #2 Right,Anterior Lower Leg: ABD pad Wound #3 Right,Proximal,Medial,Anterior Lower Leg: ABD pad Dressing Change Frequency: Wound #1 Left,Medial Lower Leg: Change Dressing Monday, Wednesday, Friday Wound #2 Right,Anterior Lower Leg: Change Dressing Monday, Wednesday, Friday Wound #3  Right,Proximal,Medial,Anterior Lower Leg: Change Dressing Monday, Wednesday, Friday Follow-up Appointments: Wound #1 Left,Medial Lower Leg: Return Appointment in 1 week. Nurse Visit as needed Wound #2 Right,Anterior Lower Leg: Return Appointment in 1 week. Nurse Visit as needed Wound #3 Right,Proximal,Medial,Anterior Lower Leg: Return Appointment in 1 week. Nurse Visit as needed Edema Control: Wound #1 Left,Medial Lower Leg: 3 Layer Compression System - Bilateral - may anchor with unna Elevate legs to the level of the heart and pump ankles as often as possible Wound #2 Right,Anterior Lower Leg: 3 Layer Compression System - Bilateral - may anchor with unna Elevate legs to the level of the heart and pump ankles as often as possible Wound #3 Right,Proximal,Medial,Anterior Lower Leg: 3  Layer Compression System - Bilateral - may anchor with unna Elevate legs to the level of the heart and pump ankles as often as possible Home Health: Wound #1 Left,Medial Lower Leg: Initiate Home Health for Skilled Nursing Home Health Nurse may visit PRN to address patient s wound care needs. FACE TO FACE ENCOUNTER: MEDICARE and MEDICAID PATIENTS: I certify that this patient is under my care and that I had a face-to-face encounter that meets the physician face-to-face encounter requirements with this patient on this date. The encounter with the patient was in whole or in part for the following MEDICAL CONDITION: (primary reason for Home Healthcare) MEDICAL NECESSITY: I certify, that based on my findings, NURSING services are a medically necessary home April Powers, April Powers ([id number]) health service. HOME BOUND STATUS: I certify that my clinical findings support that this patient is homebound (i.e., Due to illness or injury, pt requires aid of supportive devices such as crutches, cane, wheelchairs, walkers, the use of special transportation or the assistance of another person to leave their place of residence.  There is a normal inability to leave the home and doing so requires considerable and taxing effort. Other absences are for medical reasons / religious services and are infrequent or of short duration when for other reasons). If current dressing causes regression in wound condition, may D/C ordered dressing product/s and apply Normal Saline Moist Dressing daily until next Wound Healing Center / Other MD appointment. Notify Wound Healing Center of regression in wound condition at [phone]. Please direct any NON-WOUND related issues/requests for orders to patient's Primary Care Physician Wound #2 Right,Anterior Lower Leg: Initiate Home Health for Skilled Nursing Home Health Nurse may visit PRN to address patient s wound care needs. FACE TO FACE ENCOUNTER: MEDICARE and MEDICAID PATIENTS: I certify that this patient is under my care and that I had a face-to-face encounter that meets the physician face-to-face encounter requirements with this patient on this date. The encounter with the patient was in whole or in part for the following MEDICAL CONDITION: (primary reason for Home Healthcare) MEDICAL NECESSITY: I certify, that based on my findings, NURSING services are a medically necessary home health service. HOME BOUND STATUS: I certify that my clinical findings support that this patient is homebound (i.e., Due to illness or injury, pt requires aid of supportive devices such as crutches, cane, wheelchairs, walkers, the use of special transportation or the assistance of another person to leave their place of residence. There is a normal inability to leave the home and doing so requires considerable and taxing effort. Other absences are for medical reasons / religious services and are infrequent or of short duration when for other reasons). If current dressing causes regression in wound condition, may D/C ordered dressing product/s and apply Normal Saline Moist Dressing daily until next Wound  Healing Center / Other MD appointment. Notify Wound Healing Center of regression in wound condition at [phone]. Please direct any NON-WOUND related issues/requests for orders to patient's Primary Care Physician Wound #3 Right,Proximal,Medial,Anterior Lower Leg: Initiate Home Health for Skilled Nursing Home Health Nurse may visit PRN to address patient s wound care needs. FACE TO FACE ENCOUNTER: MEDICARE and MEDICAID PATIENTS: I certify that this patient is under my care and that I had a face-to-face encounter that meets the physician face-to-face encounter requirements with this patient on this date. The encounter with the patient was in whole or in part for the following MEDICAL CONDITION: (primary reason for Home Healthcare) MEDICAL NECESSITY: I certify, that based  on my findings, NURSING services are a medically necessary home health service. HOME BOUND STATUS: I certify that my clinical findings support that this patient is homebound (i.e., Due to illness or injury, pt requires aid of supportive devices such as crutches, cane, wheelchairs, walkers, the use of special transportation or the assistance of another person to leave their place of residence. There is a normal inability to leave the home and doing so requires considerable and taxing effort. Other absences are for medical reasons / religious services and are infrequent or of short duration when for other reasons). If current dressing causes regression in wound condition, may D/C ordered dressing product/s and apply Normal Saline Moist Dressing daily until next Wound Healing Center / Other MD appointment. Notify Wound Healing Center of regression in wound condition at [phone]. Please direct any NON-WOUND related issues/requests for orders to patient's Primary Care Physician 1. I would recommend currently that we continue at this time with the current wound care measures including XtraSorb which will help to absorb the  drainage hopefully better and not have anything that will stick to her wound. I think along with this we will continue with a 3 layer compression wrap and we will get a set up home health as well so that the patient will be able to have this changed at home to prevent things from worsening. 2. With regard to elevation the patient does need to try to elevate her legs as much as possible. I do think this will help with her edema to some degree. 3. With regard to home health I think it may be possible that her primary care provider could also see about getting her set up with physical therapy at home as well which could be beneficial. We will see patient back for reevaluation in 1 week here in the clinic. If anything worsens or changes patient will contact our office for additional recommendations. Electronic Signature(s) April Powers, April Powers ([id number]) Signed: 07/21/2019 6:12:50 PM By: Lenda Kelp PA-C Entered By: Lenda Kelp on 07/21/2019 18:08:49 April Powers ([id number]) -------------------------------------------------------------------------------- ROS/PFSH Details Patient Name: April Powers Date of Service: 07/20/2019 2:00 PM Medical Record Number: [id number] Patient Account Number: [id number] Date of Birth/Sex: [birth year] (85 y.o. F) Treating RN: Curtis Sites Primary Care Provider: Orson Eva Other Clinician: Referring Provider: Orson Eva Treating Provider/Extender: STONE III, HOYT Weeks in Treatment: 1 Information Obtained From Patient Constitutional Symptoms (General Health) Complaints and Symptoms: Negative for: Fatigue; Fever; Chills; Marked Weight Change Respiratory Complaints and Symptoms: Negative for: Chronic or frequent coughs; Shortness of Breath Cardiovascular Complaints and Symptoms: Negative for: Chest pain; LE edema Medical History: Positive for: Hypertension; Myocardial Infarction - 5 years ago Negative for: Angina; Arrhythmia; Congestive  Heart Failure; Coronary Artery Disease; Deep Vein Thrombosis; Hypotension; Peripheral Arterial Disease; Peripheral Venous Disease; Phlebitis; Vasculitis Psychiatric Complaints and Symptoms: Negative for: Anxiety; Claustrophobia Genitourinary Medical History: Negative for: End Stage Renal Disease Integumentary (Skin) Medical History: Negative for: History of Burn; History of pressure wounds Musculoskeletal Medical History: Positive for: Osteoarthritis Negative for: Gout; Rheumatoid Arthritis; Osteomyelitis Neurologic Medical History: Positive for: Dementia Negative for: Neuropathy; Quadriplegia; Paraplegia; Seizure Disorder Past Medical History Notes: NITYA, CAUTHON ([id number]) CVA with anoxic brain injury Immunizations Pneumococcal Vaccine: Received Pneumococcal Vaccination: No Implantable Devices None Family and Social History Cancer: Yes - Siblings; Diabetes: Yes - Siblings; Heart Disease: Yes - Siblings,Father; Hereditary Spherocytosis: No; Hypertension: Yes - Father,Siblings; Kidney Disease: No; Lung Disease: No; Seizures: No; Stroke: No; Thyroid Problems: No; Tuberculosis: No; Never smoker; Marital  Status - Widowed; Alcohol Use: Never; Drug Use: No History; Caffeine Use: Rarely; Financial Concerns: No; Food, Clothing or Shelter Needs: No; Support System Lacking: No; Transportation Concerns: No Physician Affirmation I have reviewed and agree with the above information. Electronic Signature(s) Signed: 07/21/2019 6:12:50 PM By: Lenda Kelp PA-C Signed: 07/22/2019 5:17:59 PM By: Curtis Sites Entered By: Lenda Kelp on 07/21/2019 18:07:23 April Powers ([id number]) -------------------------------------------------------------------------------- SuperBill Details Patient Name: April Powers Date of Service: 07/20/2019 Medical Record Number: [id number] Patient Account Number: [id number] Date of Birth/Sex: 07-06-1928 (85 y.o. F) Treating RN: Curtis Sites Primary Care Provider: Orson Eva Other Clinician: Referring Provider: Orson Eva Treating Provider/Extender: Linwood Dibbles, HOYT Weeks in Treatment: 1 Diagnosis Coding ICD-10 Codes Code Description I89.0 Lymphedema, not elsewhere classified I87.2 Venous insufficiency (chronic) (peripheral) L97.822 Non-pressure chronic ulcer of other part of left lower leg with fat layer exposed L97.812 Non-pressure chronic ulcer of other part of right lower leg with fat layer exposed F01.50 Vascular dementia without behavioral disturbance I10 Essential (primary) hypertension Facility Procedures CPT4: Description Modifier Quantity Code [id number] BILATERAL: Application of multi-layer venous compression system; leg (below 1 knee), including ankle and foot. Physician Procedures CPT4 Code Description: [id number] - WC PHYS LEVEL 4 - EST PT ICD-10 Diagnosis Description I89.0 Lymphedema, not elsewhere classified I87.2 Venous insufficiency (chronic) (peripheral) L97.822 Non-pressure chronic ulcer of other part of left lower leg  wit L97.812 Non-pressure chronic ulcer of other part of right lower leg wi Modifier: h fat layer expose th fat layer expos Quantity: 1 d ed Electronic Signature(s) Signed: 07/21/2019 6:12:50 PM By: Lenda Kelp PA-C Previous Signature: 07/20/2019 5:32:28 PM Version By: Curtis Sites Entered By: Lenda Kelp on 07/21/2019 18:09:00

## 2019-07-20 NOTE — Progress Notes (Addendum)
April Powers, April Powers ([id number]) Visit Report for 07/20/2019 Arrival Information Details Patient Name: April Powers Date of Service: 07/20/2019 2:00 PM Medical Record Number: [id number] Patient Account Number: [id number] Date of Birth/Sex: 10/17/1927 (85 y.o. F) Treating RN: Huel Coventry Primary Care Arna Luis: Orson Eva Other Clinician: Referring Nixon Sparr: Orson Eva Treating Enzo Treu/Extender: Linwood Dibbles, HOYT Weeks in Treatment: 1 Visit Information History Since Last Visit Added or deleted any medications: No Patient Arrived: Wheel Chair Any new allergies or adverse reactions: No Arrival Time: 14:43 Had a fall or experienced change in No Accompanied By: niece, Miranda activities of daily living that may affect Transfer Assistance: Manual risk of falls: Patient Identification Verified: Yes Signs or symptoms of abuse/neglect since No Secondary Verification Process Yes last visito Completed: Hospitalized since last visit: No Patient Has Alerts: Yes Implantable device outside of the clinic No Patient Alerts: Patient on Blood excluding Thinner cellular tissue based products placed in the [facility] since last visit: Has Dressing in Place as Prescribed: Yes Pain Present Now: Unable to Respond Notes Upon arrival patient was walking in with her niece using a cane. Patient was very unstable so we placed her into a wheelchair. Patient transferred from wheelchair to chair with 2 person assist. She flung herself into the chair. Patient was not injured in the transfer. but needs to be monitored closely during transfers. Electronic Signature(s) Signed: 07/20/2019 5:21:17 PM By: Elliot Gurney, BSN, RN, CWS, Kim RN, BSN Entered By: Elliot Gurney, BSN, RN, CWS, Kim on 07/20/2019 14:47:08 April Powers ([id number]) -------------------------------------------------------------------------------- Compression Therapy Details Patient Name: April Powers Date of Service: 07/20/2019 2:00  PM Medical Record Number: [id number] Patient Account Number: [id number] Date of Birth/Sex: 1927/11/10 (85 y.o. F) Treating RN: Curtis Sites Primary Care Noemi Ishmael: Orson Eva Other Clinician: Referring Starlit Raburn: Orson Eva Treating Meara Wiechman/Extender: STONE III, HOYT Weeks in Treatment: 1 Compression Therapy Performed for Wound Assessment: Wound #1 Left,Medial Lower Leg Performed By: Clinician Curtis Sites, RN Compression Type: Three Layer Pre Treatment ABI: 1 Post Procedure Diagnosis Same as Pre-procedure Electronic Signature(s) Signed: 07/20/2019 5:32:28 PM By: Curtis Sites Entered By: Curtis Sites on 07/20/2019 15:47:13 April Powers ([id number]) -------------------------------------------------------------------------------- Compression Therapy Details Patient Name: April Powers Date of Service: 07/20/2019 2:00 PM Medical Record Number: [id number] Patient Account Number: [id number] Date of Birth/Sex: 1927/12/19 (85 y.o. F) Treating RN: Curtis Sites Primary Care Leni Pankonin: Orson Eva Other Clinician: Referring Kenyatta Gloeckner: Orson Eva Treating Eldana Isip/Extender: Linwood Dibbles, HOYT Weeks in Treatment: 1 Compression Therapy Performed for Wound Assessment: Wound #2 Right,Anterior Lower Leg Performed By: Clinician Curtis Sites, RN Compression Type: Three Layer Pre Treatment ABI: 1 Post Procedure Diagnosis Same as Pre-procedure Electronic Signature(s) Signed: 07/20/2019 5:32:28 PM By: Curtis Sites Entered By: Curtis Sites on 07/20/2019 15:47:13 April Powers ([id number]) -------------------------------------------------------------------------------- Compression Therapy Details Patient Name: April Powers Date of Service: 07/20/2019 2:00 PM Medical Record Number: [id number] Patient Account Number: [id number] Date of Birth/Sex: 13-Oct-1927 (85 y.o. F) Treating RN: Curtis Sites Primary Care Geran Haithcock: Orson Eva Other Clinician: Referring  Jaceon Heiberger: Orson Eva Treating Mar Walmer/Extender: STONE III, HOYT Weeks in Treatment: 1 Compression Therapy Performed for Wound Assessment: Wound #3 Right,Proximal,Medial,Anterior Lower Leg Performed By: Clinician Curtis Sites, RN Compression Type: Three Layer Pre Treatment ABI: 1 Post Procedure Diagnosis Same as Pre-procedure Electronic Signature(s) Signed: 07/20/2019 5:32:28 PM By: Curtis Sites Entered By: Curtis Sites on 07/20/2019 15:47:13 April Powers ([id number]) -------------------------------------------------------------------------------- Encounter Discharge Information Details Patient Name: April Powers Date of Service: 07/20/2019 2:00 PM Medical Record Number: [id number] Patient Account Number: [id number] Date of Birth/Sex: 03/14/1928 (85 y.o. F) Treating  RN: Montey Hora Primary Care Kalley Nicholl: Evern Bio Other Clinician: Referring Arbor Cohen: Evern Bio Treating Chou Busler/Extender: Melburn Hake, HOYT Weeks in Treatment: 1 Encounter Discharge Information Items Discharge Condition: Stable Ambulatory Status: Cane Discharge Destination: Home Transportation: Private Auto Accompanied By: daughter Schedule Follow-up Appointment: Yes Clinical Summary of Care: Electronic Signature(s) Signed: 07/20/2019 5:32:28 PM By: Montey Hora Entered By: Montey Hora on 07/20/2019 15:55:47 April Powers ([id number]) -------------------------------------------------------------------------------- Lower Extremity Assessment Details Patient Name: April Powers Date of Service: 07/20/2019 2:00 PM Medical Record Number: [id number] Patient Account Number: [id number] Date of Birth/Sex: 1928-10-07 (85 y.o. F) Treating RN: Cornell Barman Primary Care Aishia Barkey: Evern Bio Other Clinician: Referring Erico Stan: Evern Bio Treating Jodell Weitman/Extender: STONE III, HOYT Weeks in Treatment: 1 Edema Assessment Assessed: [Left: No] [Right: No] [Left: Edema] [Right:  :] Calf Left: Right: Point of Measurement: 34 cm From Medial Instep 38 cm 39.5 cm Ankle Left: Right: Point of Measurement: 10 cm From Medial Instep 22.5 cm 22 cm Vascular Assessment Pulses: Dorsalis Pedis Palpable: [Left:Yes] [Right:Yes] Notes Patient has pitting edema in both legs and is draining fluid thought pin-dot areas all over her lower legs. Electronic Signature(s) Signed: 07/20/2019 5:21:17 PM By: Gretta Cool, BSN, RN, CWS, Kim RN, BSN Entered By: Gretta Cool, BSN, RN, CWS, Kim on 07/20/2019 15:24:51 April Powers ([id number]) -------------------------------------------------------------------------------- Multi Wound Chart Details Patient Name: April Powers Date of Service: 07/20/2019 2:00 PM Medical Record Number: [id number] Patient Account Number: [id number] Date of Birth/Sex: [birth year] (85 y.o. F) Treating RN: Montey Hora Primary Care Durene Dodge: Evern Bio Other Clinician: Referring Quitman Norberto: Evern Bio Treating Ajani Rineer/Extender: STONE III, HOYT Weeks in Treatment: 1 Vital Signs Height(in): 68 Pulse(bpm): 108 Weight(lbs): 180 Blood Pressure(mmHg): 105/74 Body Mass Index(BMI): 27 Temperature(F): 99.5 Respiratory Rate 16 (breaths/min): Photos: Wound Location: Left, Medial Lower Leg Right, Anterior Lower Leg Right, Proximal, Medial, Anterior Lower Leg Wounding Event: Blister Blister Gradually Appeared Primary Etiology: Venous Leg Ulcer Venous Leg Ulcer Venous Leg Ulcer Date Acquired: 07/02/2019 07/02/2019 07/13/2019 Weeks of Treatment: 1 1 0 Wound Status: Open Open Open Measurements L x W x D 4.9x5.3x0.1 1.2x0.5x0.1 0.5x0.3x0.1 (cm) Area (cm) : 20.397 0.471 0.118 Volume (cm) : 2.04 0.047 0.012 % Reduction in Area: 6.30% -200.00% N/A % Reduction in Volume: 6.30% -193.70% N/A Classification: Full Thickness Without Full Thickness Without N/A Exposed Support Structures Exposed Support Structures Treatment Notes Electronic Signature(s) Signed:  07/20/2019 5:32:28 PM By: Montey Hora Entered By: Montey Hora on 07/20/2019 15:44:12 April Powers ([id number]) -------------------------------------------------------------------------------- Multi-Disciplinary Care Plan Details Patient Name: April Powers Date of Service: 07/20/2019 2:00 PM Medical Record Number: [id number] Patient Account Number: [id number] Date of Birth/Sex: 1928-04-15 (85 y.o. F) Treating RN: Montey Hora Primary Care Analie Katzman: Evern Bio Other Clinician: Referring Ramesh Moan: Evern Bio Treating Elorah Dewing/Extender: Melburn Hake, HOYT Weeks in Treatment: 1 Active Inactive Orientation to the Wound Care Program Nursing Diagnoses: Knowledge deficit related to the wound healing center program Goals: Patient/caregiver will verbalize understanding of the [facility] Date Initiated: 07/12/2019 Target Resolution Date: 08/12/2019 Goal Status: Active Interventions: Provide education on orientation to the wound center Notes: Venous Leg Ulcer Nursing Diagnoses: Potential for venous Insuffiency (use before diagnosis confirmed) Goals: Patient will maintain optimal edema control Date Initiated: 07/12/2019 Target Resolution Date: 08/12/2019 Goal Status: Active Interventions: Assess peripheral edema status every visit. Treatment Activities: Therapeutic compression applied : 07/12/2019 Notes: Wound/Skin Impairment Nursing Diagnoses: Impaired tissue integrity Goals: Patient/caregiver will verbalize understanding of skin care regimen Date Initiated: 07/12/2019 Target Resolution Date: 07/12/2019 Goal Status: Active JUNO, ALERS ([id number]) Ulcer/skin breakdown will have a  volume reduction of 30% by week 4 Date Initiated: 07/12/2019 Target Resolution Date: 08/12/2019 Goal Status: Active Interventions: Assess ulceration(s) every visit Treatment Activities: Skin care regimen initiated : 07/12/2019 Topical wound management initiated :  07/12/2019 Notes: Electronic Signature(s) Signed: 07/20/2019 5:32:28 PM By: Curtis Sitesorthy, Joanna Entered By: Curtis Sitesorthy, Joanna on 07/20/2019 15:43:52 April QuintWINSTEAD, April Powers ([id number]) -------------------------------------------------------------------------------- Pain Assessment Details Patient Name: April QuintWINSTEAD, April Powers Date of Service: 07/20/2019 2:00 PM Medical Record Number: [id number] Patient Account Number: [id number] Date of Birth/Sex: Jun 26, 1928 (85 y.o. F) Treating RN: Huel CoventryWoody, Kim Primary Care Tamilyn Lupien: Orson EvaBOSWELL, CHELSA Other Clinician: Referring Oleg Oleson: Orson EvaBOSWELL, CHELSA Treating Alfie Alderfer/Extender: Linwood DibblesSTONE III, HOYT Weeks in Treatment: 1 Active Problems Location of Pain Severity and Description of Pain Patient Has Paino Patient Unable to Respond Site Locations Pain Management and Medication Current Pain Management: Electronic Signature(s) Signed: 07/20/2019 5:21:17 PM By: Elliot GurneyWoody, BSN, RN, CWS, Kim RN, BSN Entered By: Elliot GurneyWoody, BSN, RN, CWS, Kim on 07/20/2019 14:47:19 April QuintWINSTEAD, Eliannah ([id number]) -------------------------------------------------------------------------------- Patient/Caregiver Education Details Patient Name: April QuintWINSTEAD, April Powers Date of Service: 07/20/2019 2:00 PM Medical Record Number: [id number] Patient Account Number: [id number] Date of Birth/Gender: Jun 26, 1928 (85 y.o. F) Treating RN: Curtis Sitesorthy, Joanna Primary Care Physician: Orson EvaBOSWELL, CHELSA Other Clinician: Referring Physician: Orson EvaBOSWELL, CHELSA Treating Physician/Extender: Skeet SimmerSTONE III, HOYT Weeks in Treatment: 1 Education Assessment Education Provided To: Caregiver Education Topics Provided Safety: Handouts: Other: safety in clinic by Leonard SchwartzHoyt Methods: Explain/Verbal Responses: State content correctly Venous: Handouts: Other: [facility] compression therapy Methods: Explain/Verbal Responses: State content correctly Electronic Signature(s) Signed: 07/20/2019 5:32:28 PM By: Curtis Sitesorthy, Joanna Entered By: Curtis Sitesorthy, Joanna on  07/20/2019 15:50:51 April QuintWINSTEAD, April Powers ([id number]) -------------------------------------------------------------------------------- Wound Assessment Details Patient Name: April QuintWINSTEAD, April Powers Date of Service: 07/20/2019 2:00 PM Medical Record Number: [id number] Patient Account Number: [id number] Date of Birth/Sex: Jun 26, 1928 (85 y.o. F) Treating RN: Huel CoventryWoody, Kim Primary Care Amaziah Raisanen: Orson EvaBOSWELL, CHELSA Other Clinician: Referring Jem Castro: Orson EvaBOSWELL, CHELSA Treating Bunny Lowdermilk/Extender: Linwood DibblesSTONE III, HOYT Weeks in Treatment: 1 Wound Status Wound Number: 1 Primary Etiology: Venous Leg Ulcer Wound Location: Left, Medial Lower Leg Wound Status: Open Wounding Event: Blister Date Acquired: 07/02/2019 Weeks Of Treatment: 1 Clustered Wound: No Photos Photo Uploaded By: Elliot GurneyWoody, BSN, RN, CWS, Kim on 07/20/2019 15:26:22 Wound Measurements Length: (cm) 4.9 Width: (cm) 5.3 Depth: (cm) 0.1 Area: (cm) 20.397 Volume: (cm) 2.04 % Reduction in Area: 6.3% % Reduction in Volume: 6.3% Wound Description Full Thickness Without Exposed Support Classification: Structures Treatment Notes Wound #1 (Left, Medial Lower Leg) Notes X-sorb, 3L(B), unna to anchor Electronic Signature(s) Signed: 07/20/2019 5:21:17 PM By: Elliot GurneyWoody, BSN, RN, CWS, Kim RN, BSN Entered By: Elliot GurneyWoody, BSN, RN, CWS, Kim on 07/20/2019 15:05:44 April QuintWINSTEAD, April Powers ([id number]) -------------------------------------------------------------------------------- Wound Assessment Details Patient Name: April QuintWINSTEAD, April Powers Date of Service: 07/20/2019 2:00 PM Medical Record Number: [id number] Patient Account Number: [id number] Date of Birth/Sex: Jun 26, 1928 (85 y.o. F) Treating RN: Huel CoventryWoody, Kim Primary Care Nyomie Ehrlich: Orson EvaBOSWELL, CHELSA Other Clinician: Referring Carnella Fryman: Orson EvaBOSWELL, CHELSA Treating Ada Woodbury/Extender: Linwood DibblesSTONE III, HOYT Weeks in Treatment: 1 Wound Status Wound Number: 2 Primary Etiology: Venous Leg Ulcer Wound Location: Right, Anterior Lower Leg Wound Status:  Open Wounding Event: Blister Date Acquired: 07/02/2019 Weeks Of Treatment: 1 Clustered Wound: No Photos Photo Uploaded By: Elliot GurneyWoody, BSN, RN, CWS, Kim on 07/20/2019 15:26:23 Wound Measurements Length: (cm) 1.2 Width: (cm) 0.5 Depth: (cm) 0.1 Area: (cm) 0.471 Volume: (cm) 0.047 % Reduction in Area: -200% % Reduction in Volume: -193.7% Wound Description Full Thickness Without Exposed Support Classification: Structures Treatment Notes Wound #2 (Right, Anterior Lower Leg) Notes X-sorb, 3L(B), unna to anchor Electronic Signature(s) Signed: 07/20/2019  5:21:17 PM By: Elliot Gurney, BSN, RN, CWS, Kim RN, BSN Entered By: Elliot Gurney, BSN, RN, CWS, Kim on 07/20/2019 15:20:29 April Powers ([id number]) -------------------------------------------------------------------------------- Wound Assessment Details Patient Name: April Powers Date of Service: 07/20/2019 2:00 PM Medical Record Number: [id number] Patient Account Number: [id number] Date of Birth/Sex: [birth year] (85 y.o. F) Treating RN: Huel Coventry Primary Care Lahari Suttles: Orson Eva Other Clinician: Referring Maebelle Sulton: Orson Eva Treating Jefry Lesinski/Extender: STONE III, HOYT Weeks in Treatment: 1 Wound Status Wound Number: 3 Primary Etiology: Venous Leg Ulcer Wound Location: Right, Proximal, Medial, Anterior Lower Wound Status: Open Leg Wounding Event: Gradually Appeared Date Acquired: 07/13/2019 Weeks Of Treatment: 0 Clustered Wound: No Photos Photo Uploaded By: Elliot Gurney, BSN, RN, CWS, Kim on 07/20/2019 15:26:43 Wound Measurements Length: (cm) Width: (cm) Depth: (cm) Area: (cm) Volume: (cm) 0.5 % Reduction in Area: 0.3 % Reduction in Volume: 0.1 0.118 0.012 Treatment Notes Wound #3 (Right, Proximal, Medial, Anterior Lower Leg) Notes X-sorb, 3L(B), unna to anchor Electronic Signature(s) Signed: 07/20/2019 5:21:17 PM By: Elliot Gurney, BSN, RN, CWS, Kim RN, BSN Entered By: Elliot Gurney, BSN, RN, CWS, Kim on 07/20/2019  15:20:30 April Powers ([id number]) -------------------------------------------------------------------------------- Vitals Details Patient Name: April Powers Date of Service: 07/20/2019 2:00 PM Medical Record Number: [id number] Patient Account Number: [id number] Date of Birth/Sex: [birth year] (85 y.o. F) Treating RN: Huel Coventry Primary Care Alexzandrea Normington: Orson Eva Other Clinician: Referring Rayder Sullenger: Orson Eva Treating Gopal Malter/Extender: Linwood Dibbles, HOYT Weeks in Treatment: 1 Vital Signs Time Taken: 14:47 Temperature (F): 99.5 Height (in): 68 Pulse (bpm): 108 Weight (lbs): 180 Respiratory Rate (breaths/min): 16 Body Mass Index (BMI): 27.4 Blood Pressure (mmHg): 105/74 Reference Range: 80 - 120 mg / dl Electronic Signature(s) Signed: 07/20/2019 5:21:17 PM By: Elliot Gurney, BSN, RN, CWS, Kim RN, BSN Entered By: Elliot Gurney, BSN, RN, CWS, Kim on 07/20/2019 14:48:18

## 2019-07-26 ENCOUNTER — Ambulatory Visit: Payer: Medicare HMO | Admitting: Physician Assistant

## 2019-07-30 ENCOUNTER — Encounter: Payer: Medicare HMO | Admitting: Physician Assistant

## 2019-07-30 ENCOUNTER — Other Ambulatory Visit: Payer: Self-pay

## 2019-07-30 DIAGNOSIS — L97812 Non-pressure chronic ulcer of other part of right lower leg with fat layer exposed: Secondary | ICD-10-CM | POA: Diagnosis not present

## 2019-07-30 NOTE — Progress Notes (Addendum)
[name], [name] ([id number]) Visit Report for 07/30/2019 Arrival Information Details Patient Name: April Powers Date of Service: 07/30/2019 2:45 PM Medical Record Number: [id number] Patient Account Number: [id number] Date of Birth/Sex: April 07, 1928 (85 y.o. F) Treating RN: April Powers Primary Care April Powers: April Powers Other Clinician: Referring April Powers: April Powers Treating April Powers/Extender: April Powers, April Powers Weeks in Treatment: 2 Visit Information History Since Last Visit Added or deleted any medications: No Patient Arrived: Wheel Chair Any new allergies or adverse reactions: No Arrival Time: 15:03 Had a fall or experienced change in No Accompanied By: sister activities of daily living that may affect Transfer Assistance: Manual risk of falls: Patient Identification Verified: Yes Signs or symptoms of abuse/neglect since last visito No Secondary Verification Process Yes Hospitalized since last visit: No Completed: Implantable device outside of the clinic excluding No Patient Has Alerts: Yes cellular tissue based products placed in the center Patient Alerts: Patient on Blood since last visit: Thinner Has Dressing in Place as Prescribed: Yes Eliquis Pain Present Now: No Electronic Signature(s) Signed: 07/30/2019 4:16:28 PM By: April Powers Entered By: April Powers on 07/30/2019 15:04:33 April Powers ([id number]) -------------------------------------------------------------------------------- Encounter Discharge Information Details Patient Name: April Powers Date of Service: 07/30/2019 2:45 PM Medical Record Number: [id number] Patient Account Number: [id number] Date of Birth/Sex: 1928-09-24 (85 y.o. F) Treating RN: April Powers Primary Care Namiah Dunnavant: April Powers Other Clinician: Referring April Powers: April Powers Treating April Powers/Extender: April Powers, April Powers Weeks in Treatment: 2 Encounter Discharge  Information Items Post Procedure Vitals Discharge Condition: Stable Temperature (F): 98.4 Ambulatory Status: Wheelchair Pulse (bpm): 84 Discharge Destination: Home Respiratory Rate (breaths/min): 16 Transportation: Private Auto Blood Pressure (mmHg): 119/85 Accompanied By: daughter Schedule Follow-up Appointment: Yes Clinical Summary of Care: Electronic Signature(s) Signed: 07/30/2019 5:17:10 PM By: April Powers Entered By: April Powers on 07/30/2019 16:02:42 April Powers ([id number]) -------------------------------------------------------------------------------- Lower Extremity Assessment Details Patient Name: April Powers Date of Service: 07/30/2019 2:45 PM Medical Record Number: [id number] Patient Account Number: [id number] Date of Birth/Sex: July 04, 1928 (85 y.o. F) Treating RN: April Powers Primary Care Sumayah Bearse: April Powers Other Clinician: Referring Harlee Pursifull: April Powers Treating Minsa Weddington/Extender: April Powers, April Powers Weeks in Treatment: 2 Edema Assessment Assessed: [Left: No] [Right: No] [Left: Edema] [Right: :] Calf Left: Right: Point of Measurement: 34 cm From Medial Instep 3.6 cm 35 cm Ankle Left: Right: Point of Measurement: 10 cm From Medial Instep 21.2 cm 21.4 cm Vascular Assessment Pulses: Dorsalis Pedis Palpable: [Left:Yes] [Right:Yes] Electronic Signature(s) Signed: 07/30/2019 4:31:01 PM By: April Powers Entered By: April Powers on 07/30/2019 15:22:44 April Powers ([id number]) -------------------------------------------------------------------------------- Multi Wound Chart Details Patient Name: April Powers Date of Service: 07/30/2019 2:45 PM Medical Record Number: [id number] Patient Account Number: [id number] Date of Birth/Sex: 1928/01/27 (85 y.o. F) Treating RN: April Powers Primary Care Kristie Bracewell: April Powers Other Clinician: Referring Shanterria Franta: April Powers Treating April Powers/Extender: April Powers, April Powers Weeks in Treatment:  2 Vital Signs Height(in): 68 Pulse(bpm): 84 Weight(lbs): 180 Blood Pressure(mmHg): 119/85 Body Mass Index(BMI): 27 Temperature(F): 98.4 Respiratory Rate 16 (breaths/min): Photos: Wound Location: Left Lower Leg - Medial Right Lower Leg - Anterior Right, Proximal, Medial, Anterior Lower Leg Wounding Event: Blister Blister Gradually Appeared Primary Etiology: Venous Leg Ulcer Venous Leg Ulcer Venous Leg Ulcer Comorbid History: Hypertension, Myocardial Hypertension, Myocardial Hypertension, Myocardial Infarction, Osteoarthritis, Infarction, Osteoarthritis, Infarction, Osteoarthritis, Dementia Dementia Dementia Date Acquired: 07/02/2019 07/02/2019 07/13/2019 Weeks of Treatment: 2 2 1  Wound Status: Open Open Healed - Epithelialized Measurements L x W x D 5.2x5.5x0.1 0.3x0.2x0.1 0x0x0 (cm) Area (cm) : 22.462 0.047 0  Volume (cm) : 2.246 0.005 0 % Reduction in Area: -3.10% 70.10% 100.00% % Reduction in Volume: -3.10% 68.80% 100.00% Classification: Full Thickness Without Full Thickness Without Partial Thickness Exposed Support Structures Exposed Support Structures Exudate Amount: Large Small None Present Exudate Type: Serous Serous N/A Exudate Color: amber amber N/A Wound Margin: Flat and Intact Flat and Intact Flat and Intact Granulation Amount: Medium (34-66%) Large (67-100%) None Present (0%) Granulation Quality: Red Red N/A Necrotic Amount: Medium (34-66%) Small (1-33%) None Present (0%) Exposed Structures: Fat Layer (Subcutaneous Fat Layer (Subcutaneous Fascia: No Tissue) Exposed: Yes Tissue) Exposed: Yes Fat Layer (Subcutaneous Fascia: No Fascia: No Tissue) Exposed: No Tendon: No Tendon: No Tendon: No April Powers, April ([id number]) Muscle: No Muscle: No Muscle: No Joint: No Joint: No Joint: No Bone: No Bone: No Bone: No Limited to Skin Breakdown Epithelialization: None Medium (34-66%) Large (67-100%) Treatment Notes Electronic Signature(s) Signed: 07/30/2019  5:17:10 PM By: April Powers, April Powers on 07/30/2019 15:57:28 April Powers ([id number]) -------------------------------------------------------------------------------- Multi-Disciplinary Care Plan Details Patient Name: April QuintWINSTEAD, Sanna Date of Service: 07/30/2019 2:45 PM Medical Record Number: [id number] Patient Account Number: [id number] Date of Birth/Sex: 1928-06-19 (85 y.o. F) Treating RN: April Powers, April Primary Care Kairen Hallinan: Orson EvaBOSWELL, CHELSA Other Clinician: Referring Korrie Hofbauer: Orson EvaBOSWELL, CHELSA Treating Lynae Pederson/Extender: Linwood DibblesSTONE Powers, April Powers Weeks in Treatment: 2 Active Inactive Orientation to the Wound Care Program Nursing Diagnoses: Knowledge deficit related to the wound healing center program Goals: Patient/caregiver will verbalize understanding of the Wound Healing Center Program Date Initiated: 07/12/2019 Target Resolution Date: 08/12/2019 Goal Status: Active Interventions: Provide education on orientation to the wound center Notes: Venous Leg Ulcer Nursing Diagnoses: Potential for venous Insuffiency (use before diagnosis confirmed) Goals: Patient will maintain optimal edema control Date Initiated: 07/12/2019 Target Resolution Date: 08/12/2019 Goal Status: Active Interventions: Assess peripheral edema status every visit. Treatment Activities: Therapeutic compression applied : 07/12/2019 Notes: Wound/Skin Impairment Nursing Diagnoses: Impaired tissue integrity Goals: Patient/caregiver will verbalize understanding of skin care regimen Date Initiated: 07/12/2019 Target Resolution Date: 07/12/2019 Goal Status: Active April QuintWINSTEAD, Glender ([id number]) Ulcer/skin breakdown will have a volume reduction of 30% by week 4 Date Initiated: 07/12/2019 Target Resolution Date: 08/12/2019 Goal Status: Active Interventions: Assess ulceration(s) every visit Treatment Activities: Skin care regimen initiated : 07/12/2019 Topical wound management initiated :  07/12/2019 Notes: Electronic Signature(s) Signed: 07/30/2019 5:17:10 PM By: April Powers, April Powers on 07/30/2019 15:56:45 April QuintWINSTEAD, Lichelle ([id number]) -------------------------------------------------------------------------------- Pain Assessment Details Patient Name: April QuintWINSTEAD, Revia Date of Service: 07/30/2019 2:45 PM Medical Record Number: [id number] Patient Account Number: [id number] Date of Birth/Sex: 1928-06-19 (85 y.o. F) Treating RN: April Powers, April Primary Care Palmyra Rogacki: Orson EvaBOSWELL, CHELSA Other Clinician: Referring Abie Killian: Orson EvaBOSWELL, CHELSA Treating Raihana Balderrama/Extender: Linwood DibblesSTONE Powers, April Powers Weeks in Treatment: 2 Active Problems Location of Pain Severity and Description of Pain Patient Has Paino No Site Locations Pain Management and Medication Current Pain Management: Electronic Signature(s) Signed: 07/30/2019 4:16:28 PM By: Dayton MartesWallace, RCP,RRT,Powers, Sallie RCP, RRT, Powers Signed: 07/30/2019 5:17:10 PM By: April Powers, April Entered By: Dayton MartesWallace, RCP,RRT,Powers, Sallie on 07/30/2019 15:04:44 April QuintWINSTEAD, Laurelyn ([id number]) -------------------------------------------------------------------------------- Patient/Caregiver Education Details Patient Name: April QuintWINSTEAD, Kadejah Date of Service: 07/30/2019 2:45 PM Medical Record Number: [id number] Patient Account Number: [id number] Date of Birth/Gender: 1928-06-19 (85 y.o. F) Treating RN: April Powers, April Primary Care Physician: Orson EvaBOSWELL, CHELSA Other Clinician: Referring Physician: Orson EvaBOSWELL, CHELSA Treating Physician/Extender: Skeet SimmerSTONE Powers, April Powers Weeks in Treatment: 2 Education Assessment Education Provided To: Caregiver Education Topics Provided Venous: Handouts: Other: need for compression Methods: Explain/Verbal Responses: State content correctly Electronic Signature(s) Signed: 07/30/2019 5:17:10 PM By: April Powers, April  Entered By: April Sites on 07/30/2019 16:01:50 April Powers  ([id number]) -------------------------------------------------------------------------------- Wound Assessment Details Patient Name: April Powers Date of Service: 07/30/2019 2:45 PM Medical Record Number: [id number] Patient Account Number: [id number] Date of Birth/Sex: Mar 14, 1928 (85 y.o. F) Treating RN: Rodell Perna Primary Care Margot Oriordan: Orson Eva Other Clinician: Referring Kaelah Hayashi: Orson Eva Treating Sherron Mapp/Extender: April Powers, April Powers Weeks in Treatment: 2 Wound Status Wound Number: 1 Primary Venous Leg Ulcer Etiology: Wound Location: Left Lower Leg - Medial Wound Status: Open Wounding Event: Blister Comorbid Hypertension, Myocardial Infarction, Date Acquired: 07/02/2019 History: Osteoarthritis, Dementia Weeks Of Treatment: 2 Clustered Wound: No Photos Wound Measurements Length: (cm) 5.2 Width: (cm) 5.5 Depth: (cm) 0.1 Area: (cm) 22.462 Volume: (cm) 2.246 % Reduction in Area: -3.1% % Reduction in Volume: -3.1% Epithelialization: None Tunneling: No Undermining: No Wound Description Full Thickness Without Exposed Support Foul Od Classification: Structures Slough/ Wound Margin: Flat and Intact Exudate Large Amount: Exudate Type: Serous Exudate Color: amber or After Cleansing: No Fibrino Yes Wound Bed Granulation Amount: Medium (34-66%) Exposed Structure Granulation Quality: Red Fascia Exposed: No Necrotic Amount: Medium (34-66%) Fat Layer (Subcutaneous Tissue) Exposed: Yes Necrotic Quality: Adherent Slough Tendon Exposed: No Muscle Exposed: No Joint Exposed: No Bone Exposed: No April Powers, April Powers ([id number]) Treatment Notes Wound #1 (Left, Medial Lower Leg) Notes silver collagen, X-sorb, 3L(B), unna to anchor Electronic Signature(s) Signed: 07/30/2019 4:31:01 PM By: Rodell Perna Entered By: Rodell Perna on 07/30/2019 15:19:14 April Powers ([id number]) -------------------------------------------------------------------------------- Wound  Assessment Details Patient Name: April Powers Date of Service: 07/30/2019 2:45 PM Medical Record Number: [id number] Patient Account Number: [id number] Date of Birth/Sex: Jun 10, 1928 (85 y.o. F) Treating RN: Rodell Perna Primary Care Sherrica Niehaus: Orson Eva Other Clinician: Referring Jaylin Roundy: Orson Eva Treating Sayler Mickiewicz/Extender: April Powers, April Powers Weeks in Treatment: 2 Wound Status Wound Number: 2 Primary Venous Leg Ulcer Etiology: Wound Location: Right Lower Leg - Anterior Wound Status: Open Wounding Event: Blister Comorbid Hypertension, Myocardial Infarction, Date Acquired: 07/02/2019 History: Osteoarthritis, Dementia Weeks Of Treatment: 2 Clustered Wound: No Photos Wound Measurements Length: (cm) 0.3 Width: (cm) 0.2 Depth: (cm) 0.1 Area: (cm) 0.047 Volume: (cm) 0.005 % Reduction in Area: 70.1% % Reduction in Volume: 68.8% Epithelialization: Medium (34-66%) Tunneling: No Undermining: No Wound Description Full Thickness Without Exposed Support Foul Od Classification: Structures Slough/ Wound Margin: Flat and Intact Exudate Small Amount: Exudate Type: Serous Exudate Color: amber or After Cleansing: No Fibrino No Wound Bed Granulation Amount: Large (67-100%) Exposed Structure Granulation Quality: Red Fascia Exposed: No Necrotic Amount: Small (1-33%) Fat Layer (Subcutaneous Tissue) Exposed: Yes Necrotic Quality: Adherent Slough Tendon Exposed: No Muscle Exposed: No Joint Exposed: No Bone Exposed: No Soto, April Powers ([id number]) Treatment Notes Wound #2 (Right, Anterior Lower Leg) Notes silver collagen, X-sorb, 3L(B), unna to anchor Electronic Signature(s) Signed: 07/30/2019 4:31:01 PM By: Rodell Perna Entered By: Rodell Perna on 07/30/2019 15:20:03 April Powers ([id number]) -------------------------------------------------------------------------------- Wound Assessment Details Patient Name: April Powers Date of Service: 07/30/2019 2:45  PM Medical Record Number: [id number] Patient Account Number: [id number] Date of Birth/Sex: December 05, 1927 (85 y.o. F) Treating RN: April Sites Primary Care Gaspare Netzel: Orson Eva Other Clinician: Referring Iyonnah Ferrante: Orson Eva Treating Zyrell Carmean/Extender: April Powers, April Powers Weeks in Treatment: 2 Wound Status Wound Number: 3 Primary Venous Leg Ulcer Etiology: Wound Location: Right, Proximal, Medial, Anterior Lower Leg Wound Status: Healed - Epithelialized Wounding Event: Gradually Appeared Comorbid Hypertension, Myocardial Infarction, History: Osteoarthritis, Dementia Date Acquired: 07/13/2019 Weeks Of Treatment: 1 Clustered Wound: No Photos Wound Measurements Length: (cm) 0 % Width: (cm) 0 % Depth: (cm) 0 E  Area: (cm) 0 Volume: (cm) 0 Reduction in Area: 100% Reduction in Volume: 100% pithelialization: Large (67-100%) Tunneling: No Undermining: No Wound Description Classification: Partial Thickness Wound Margin: Flat and Intact Exudate Amount: None Present Foul Odor After Cleansing: No Slough/Fibrino No Wound Bed Granulation Amount: None Present (0%) Exposed Structure Necrotic Amount: None Present (0%) Fascia Exposed: No Fat Layer (Subcutaneous Tissue) Exposed: No Tendon Exposed: No Muscle Exposed: No Joint Exposed: No Bone Exposed: No Limited to Skin Breakdown Electronic Signature(s) Powers, April ([id number]) Signed: 07/30/2019 5:17:10 PM By: April Sites Entered By: April Sites on 07/30/2019 15:57:20 April Powers ([id number]) -------------------------------------------------------------------------------- Vitals Details Patient Name: April Powers Date of Service: 07/30/2019 2:45 PM Medical Record Number: [id number] Patient Account Number: [id number] Date of Birth/Sex: 07/19/1928 (85 y.o. F) Treating RN: April Sites Primary Care Charlyne Robertshaw: Orson Eva Other Clinician: Referring Broedy Osbourne: Orson Eva Treating Zyshonne Malecha/Extender: April  Powers, April Powers Weeks in Treatment: 2 Vital Signs Time Taken: 15:04 Temperature (F): 98.4 Height (in): 68 Pulse (bpm): 84 Weight (lbs): 180 Respiratory Rate (breaths/min): 16 Body Mass Index (BMI): 27.4 Blood Pressure (mmHg): 119/85 Reference Range: 80 - 120 mg / dl Electronic Signature(s) Signed: 07/30/2019 4:16:28 PM By: Dayton Martes RCP, RRT, Powers Entered By: Dayton Martes on 07/30/2019 15:05:31

## 2019-07-30 NOTE — Progress Notes (Addendum)
April Powers, April Powers ([id number]) Visit Report for 07/30/2019 Chief Complaint Document Details Patient Name: April Powers, April Powers Date of Service: 07/30/2019 2:45 PM Medical Record Number: [id number] Patient Account Number: [id number] Date of Birth/Sex: 10-Sep-1928 (85 y.o. F) Treating RN: Curtis Sites Primary Care Provider: Orson Eva Other Clinician: Referring Provider: Orson Eva Treating Provider/Extender: Linwood Dibbles, HOYT Weeks in Treatment: 2 Information Obtained from: Patient Chief Complaint Bilateral LE Ulcers Electronic Signature(s) Signed: 07/30/2019 3:04:26 PM By: Lenda Kelp PA-C Entered By: Lenda Kelp on 07/30/2019 15:04:26 April Powers ([id number]) -------------------------------------------------------------------------------- Debridement Details Patient Name: April Powers Date of Service: 07/30/2019 2:45 PM Medical Record Number: [id number] Patient Account Number: [id number] Date of Birth/Sex: [birth year] (85 y.o. F) Treating RN: Curtis Sites Primary Care Provider: Orson Eva Other Clinician: Referring Provider: Orson Eva Treating Provider/Extender: Linwood Dibbles, HOYT Weeks in Treatment: 2 Debridement Performed for Wound #2 Right,Anterior Lower Leg Assessment: Performed By: Physician STONE III, HOYT E., PA-C Debridement Type: Debridement Severity of Tissue Pre Fat layer exposed Debridement: Level of Consciousness (Pre- Awake and Alert procedure): Pre-procedure Verification/Time Yes - 15:58 Out Taken: Start Time: 15:58 Pain Control: Lidocaine 4% Topical Solution Total Area Debrided (L x W): 0.3 (cm) x 0.2 (cm) = 0.06 (cm) Tissue and other material Viable, Non-Viable, Eschar, Slough, Subcutaneous, Slough debrided: Level: Skin/Subcutaneous Tissue Debridement Description: Excisional Instrument: Curette Bleeding: Minimum Hemostasis Achieved: Pressure End Time: 16:00 Procedural Pain: 0 Post Procedural Pain: 0 Response to  Treatment: Procedure was tolerated well Level of Consciousness Awake and Alert (Post-procedure): Post Debridement Measurements of Total Wound Length: (cm) 0.3 Width: (cm) 0.2 Depth: (cm) 0.1 Volume: (cm) 0.005 Character of Wound/Ulcer Post Debridement: Improved Severity of Tissue Post Debridement: Fat layer exposed Post Procedure Diagnosis Same as Pre-procedure Electronic Signature(s) Signed: 07/30/2019 5:17:10 PM By: Curtis Sites Signed: 07/31/2019 1:07:32 AM By: Lenda Kelp PA-C Entered By: Curtis Sites on 07/30/2019 15:59:29 April Powers ([id number]) -------------------------------------------------------------------------------- HPI Details Patient Name: April Powers Date of Service: 07/30/2019 2:45 PM Medical Record Number: [id number] Patient Account Number: [id number] Date of Birth/Sex: [birth year] (85 y.o. F) Treating RN: Curtis Sites Primary Care Provider: Orson Eva Other Clinician: Referring Provider: Orson Eva Treating Provider/Extender: Linwood Dibbles, HOYT Weeks in Treatment: 2 History of Present Illness HPI Description: 07/12/2019 on evaluation today patient appears for initial evaluation here in our clinic today concerning an issue that is been happening with her bilateral lower extremities and this began about 10 days ago. She does have bilateral lower extremity edema secondary to venous stasis. This does appear to be stage I lymphedema most likely. With that being said there is no signs of active infection at this time which is good news. That was one concern of her sister was that the patient may have an infection and required antibiotic. The good news is that does not seem to be the case. With that being said I do think the patient needs good edema control at this point. She has previously had compression stockings but is been quite some time since she has worn them according to her sister who appears to be her primary caregiver and who is with  her today. The patient has had a stroke and really does not talk much though fortunately other than the stroke does not appear to have a significant past medical history other than hypertension and dementia. No sharp debridement is good to be required today which is excellent news. With that being said I do believe that compression is good to be of utmost importance as far as  getting these wounds to heal as well as keeping new areas from occurring. 07/20/2019 on evaluation today patient presents for follow-up concerning ongoing issues with her bilateral lower extremities. Fortunately there is no signs of active infection at this time. With that being said she still has several open wounds that are draining quite significantly at this point. There is no signs of systemic infection. No fevers, chills, nausea, vomiting, or diarrhea. The patient does have significant dementia and has very hard time getting around here in the clinic today I did advise her niece who is with her at this point that in the future she is going to need to use a wheelchair when here at the clinic in order to prevent any additional issues such as injury while she is here on the premises. I understand I do want her to try to walk but I think she needs to do so with a walker and in her more comfortable home environment where again she is more familiar with the surroundings. Patient's family member voiced understanding. 07/30/2019 upon evaluation today patient appears to be doing very well with regard to her lower extremity ulcers. She has been tolerating the dressing changes without complication. I do feel like overall she is making significant improvement which is great news. There does not appear to be any signs of active infection also great news. Overall I am very pleased with the fact that her wounds are measuring much smaller and there is a lot of new epithelization noted at this point. Electronic Signature(s) Signed:  07/30/2019 4:32:08 PM By: Lenda KelpStone III, Hoyt PA-C Previous Signature: 07/30/2019 3:50:29 PM Version By: Lenda KelpStone III, Hoyt PA-C Entered By: Lenda KelpStone III, Hoyt on 07/30/2019 16:32:08 April Powers, April Powers ([id number]) -------------------------------------------------------------------------------- Physical Exam Details Patient Name: April Powers, April Powers Date of Service: 07/30/2019 2:45 PM Medical Record Number: [id number] Patient Account Number: [id number] Date of Birth/Sex: October 06, 1928 (85 y.o. F) Treating RN: Curtis Sitesorthy, Joanna Primary Care Provider: Orson EvaBOSWELL, CHELSA Other Clinician: Referring Provider: Orson EvaBOSWELL, CHELSA Treating Provider/Extender: STONE III, HOYT Weeks in Treatment: 2 Constitutional Well-nourished and well-hydrated in no acute distress. Respiratory normal breathing without difficulty. clear to auscultation bilaterally. Cardiovascular regular rate and rhythm with normal S1, S2. Psychiatric this patient is able to make decisions and demonstrates good insight into disease process. Alert and Oriented x 3. pleasant and cooperative. Notes Patient's wound bed currently based on what I am seeing is actually on the left leg showing signs of great epithelialization on the right leg she has a lot of improvement with regard to the size of the wound as well as the quality of the wound bed overall I am very pleased in this regard. Electronic Signature(s) Signed: 07/30/2019 4:32:32 PM By: Lenda KelpStone III, Hoyt PA-C Entered By: Lenda KelpStone III, Hoyt on 07/30/2019 16:32:32 April Powers, Earlyn ([id number]) -------------------------------------------------------------------------------- Physician Orders Details Patient Name: April Powers, Aleysia Date of Service: 07/30/2019 2:45 PM Medical Record Number: [id number] Patient Account Number: [id number] Date of Birth/Sex: October 06, 1928 (85 y.o. F) Treating RN: Curtis Sitesorthy, Joanna Primary Care Provider: Orson EvaBOSWELL, CHELSA Other Clinician: Referring Provider: Orson EvaBOSWELL, CHELSA Treating  Provider/Extender: Linwood DibblesSTONE III, HOYT Weeks in Treatment: 2 Verbal / Phone Orders: No Diagnosis Coding ICD-10 Coding Code Description I89.0 Lymphedema, not elsewhere classified I87.2 Venous insufficiency (chronic) (peripheral) L97.822 Non-pressure chronic ulcer of other part of left lower leg with fat layer exposed L97.812 Non-pressure chronic ulcer of other part of right lower leg with fat layer exposed F01.50 Vascular dementia without behavioral disturbance I10 Essential (primary) hypertension Wound Cleansing Wound #1 Left,Medial Lower Leg o May  shower with protection. - Please do not get your wraps wet o No tub bath. Wound #2 Right,Anterior Lower Leg o May shower with protection. - Please do not get your wraps wet o No tub bath. Anesthetic (add to Medication List) Wound #1 Left,Medial Lower Leg o Topical Lidocaine 4% cream applied to wound bed prior to debridement (In Clinic Only). Wound #2 Right,Anterior Lower Leg o Topical Lidocaine 4% cream applied to wound bed prior to debridement (In Clinic Only). Primary Wound Dressing Wound #1 Left,Medial Lower Leg o Silver Collagen - do not wet Wound #2 Right,Anterior Lower Leg o Silver Collagen - moisten with saline Secondary Dressing Wound #1 Left,Medial Lower Leg o XtraSorb Wound #2 Right,Anterior Lower Leg o Non-adherent pad Dressing Change Frequency Wound #1 Left,Medial Lower Leg IVELISE, CASTILLO ([id number]) o Change Dressing Monday, Wednesday, Friday - Fridays at [facility] and Mondays and Wednesdays by [facility] Wound #2 Right,Anterior Lower Leg o Change Dressing Monday, Wednesday, Friday - Fridays at [facility] and Mondays and Wednesdays by [facility] Follow-up Appointments Wound #1 Left,Medial Lower Leg o Return Appointment in 1 week. Wound #2 Right,Anterior Lower Leg o Return Appointment in 1 week. Edema Control Wound #1 Left,Medial Lower Leg o 3 Layer Compression System - Bilateral - may anchor with  unna o Elevate legs to the level of the heart and pump ankles as often as possible Wound #2 Right,Anterior Lower Leg o 3 Layer Compression System - Bilateral - may anchor with unna o Elevate legs to the level of the heart and pump ankles as often as possible Home Health Wound #1 Left,Medial Lower Leg o Continue Home Health Visits o Home Health Nurse may visit PRN to address patientos wound care needs. o FACE TO FACE ENCOUNTER: MEDICARE and MEDICAID PATIENTS: I certify that this patient is under my care and that I had a face-to-face encounter that meets the physician face-to-face encounter requirements with this patient on this date. The encounter with the patient was in whole or in part for the following MEDICAL CONDITION: (primary reason for Home Healthcare) MEDICAL NECESSITY: I certify, that based on my findings, NURSING services are a medically necessary home health service. HOME BOUND STATUS: I certify that my clinical findings support that this patient is homebound (i.e., Due to illness or injury, pt requires aid of supportive devices such as crutches, cane, wheelchairs, walkers, the use of special transportation or the assistance of another person to leave their place of residence. There is a normal inability to leave the home and doing so requires considerable and taxing effort. Other absences are for medical reasons / religious services and are infrequent or of short duration when for other reasons). o If current dressing causes regression in wound condition, may D/C ordered dressing product/s and apply Normal Saline Moist Dressing daily until next Wound Healing Center / Other MD appointment. Notify Wound Healing Center of regression in wound condition at [phone]. o Please direct any NON-WOUND related issues/requests for orders to patient's Primary Care Physician Wound #2 Right,Anterior Lower Leg o Continue Home Health Visits o Home Health Nurse may visit PRN  to address patientos wound care needs. o FACE TO FACE ENCOUNTER: MEDICARE and MEDICAID PATIENTS: I certify that this patient is under my care and that I had a face-to-face encounter that meets the physician face-to-face encounter requirements with this patient on this date. The encounter with the patient was in whole or in part for the following MEDICAL CONDITION: (primary reason for Home Healthcare) MEDICAL NECESSITY: I certify,  that based on my findings, NURSING services are a medically necessary home health service. HOME BOUND STATUS: I certify that my clinical findings support that this patient is homebound (i.e., Due to illness or injury, pt requires aid of supportive devices such as crutches, cane, wheelchairs, walkers, the use of special transportation or the assistance of another person to leave their place of residence. There is a normal inability to leave the home and doing so requires considerable and taxing effort. Other absences are for medical reasons / religious services and are infrequent or of short duration when for other reasons). o If current dressing causes regression in wound condition, may D/C ordered dressing product/s and apply Normal Saline Moist Dressing daily until next [place] / Other MD appointment. Wallace of regression in wound condition at [phone]. April Powers, April Powers ([id number]) o Please direct any NON-WOUND related issues/requests for orders to patient's Primary Care Physician Electronic Signature(s) Signed: 07/30/2019 5:17:10 PM By: Montey Hora Signed: 07/31/2019 1:07:32 AM By: Worthy Keeler PA-C Entered By: Montey Hora on 07/30/2019 16:01:26 April Powers ([id number]) -------------------------------------------------------------------------------- Problem List Details Patient Name: April Powers Date of Service: 07/30/2019 2:45 PM Medical Record Number: [id number] Patient Account Number: [id number] Date  of Birth/Sex: 1928-07-08 (85 y.o. F) Treating RN: Montey Hora Primary Care Provider: Evern Bio Other Clinician: Referring Provider: Evern Bio Treating Provider/Extender: Melburn Hake, HOYT Weeks in Treatment: 2 Active Problems ICD-10 Evaluated Encounter Code Description Active Date Today Diagnosis I89.0 Lymphedema, not elsewhere classified 07/12/2019 No Yes I87.2 Venous insufficiency (chronic) (peripheral) 07/12/2019 No Yes L97.822 Non-pressure chronic ulcer of other part of left lower leg with 07/12/2019 No Yes fat layer exposed L97.812 Non-pressure chronic ulcer of other part of right lower leg 07/12/2019 No Yes with fat layer exposed F01.50 Vascular dementia without behavioral disturbance 07/12/2019 No Yes I10 Essential (primary) hypertension 07/12/2019 No Yes Inactive Problems Resolved Problems Electronic Signature(s) Signed: 07/30/2019 3:04:21 PM By: Worthy Keeler PA-C Entered By: Worthy Keeler on 07/30/2019 15:04:21 April Powers ([id number]) -------------------------------------------------------------------------------- Progress Note Details Patient Name: April Powers Date of Service: 07/30/2019 2:45 PM Medical Record Number: [id number] Patient Account Number: [id number] Date of Birth/Sex: November 03, 1927 (85 y.o. F) Treating RN: Montey Hora Primary Care Provider: Evern Bio Other Clinician: Referring Provider: Evern Bio Treating Provider/Extender: Melburn Hake, HOYT Weeks in Treatment: 2 Subjective Chief Complaint Information obtained from Patient Bilateral LE Ulcers History of Present Illness (HPI) 07/12/2019 on evaluation today patient appears for initial evaluation here in our clinic today concerning an issue that is been happening with her bilateral lower extremities and this began about 10 days ago. She does have bilateral lower extremity edema secondary to venous stasis. This does appear to be stage I lymphedema most likely. With that being  said there is no signs of active infection at this time which is good news. That was one concern of her sister was that the patient may have an infection and required antibiotic. The good news is that does not seem to be the case. With that being said I do think the patient needs good edema control at this point. She has previously had compression stockings but is been quite some time since she has worn them according to her sister who appears to be her primary caregiver and who is with her today. The patient has had a stroke and really does not talk much though fortunately other than the stroke does not appear to have a significant past medical history other than hypertension and dementia.  No sharp debridement is good to be required today which is excellent news. With that being said I do believe that compression is good to be of utmost importance as far as getting these wounds to heal as well as keeping new areas from occurring. 07/20/2019 on evaluation today patient presents for follow-up concerning ongoing issues with her bilateral lower extremities. Fortunately there is no signs of active infection at this time. With that being said she still has several open wounds that are draining quite significantly at this point. There is no signs of systemic infection. No fevers, chills, nausea, vomiting, or diarrhea. The patient does have significant dementia and has very hard time getting around here in the clinic today I did advise her niece who is with her at this point that in the future she is going to need to use a wheelchair when here at the clinic in order to prevent any additional issues such as injury while she is here on the premises. I understand I do want her to try to walk but I think she needs to do so with a walker and in her more comfortable home environment where again she is more familiar with the surroundings. Patient's family member voiced understanding. 07/30/2019 upon evaluation  today patient appears to be doing very well with regard to her lower extremity ulcers. She has been tolerating the dressing changes without complication. I do feel like overall she is making significant improvement which is great news. There does not appear to be any signs of active infection also great news. Overall I am very pleased with the fact that her wounds are measuring much smaller and there is a lot of new epithelization noted at this point. Patient History Information obtained from Patient. Family History Cancer - Siblings, Diabetes - Siblings, Heart Disease - Siblings,Father, Hypertension - Father,Siblings, No family history of Hereditary Spherocytosis, Kidney Disease, Lung Disease, Seizures, Stroke, Thyroid Problems, Tuberculosis. Social History Never smoker, Marital Status - Widowed, Alcohol Use - Never, Drug Use - No History, Caffeine Use - Rarely. Medical History Cardiovascular Patient has history of Hypertension, Myocardial Infarction - 5 years ago April Powers, April Powers ([id number]) Denies history of Angina, Arrhythmia, Congestive Heart Failure, Coronary Artery Disease, Deep Vein Thrombosis, Hypotension, Peripheral Arterial Disease, Peripheral Venous Disease, Phlebitis, Vasculitis Genitourinary Denies history of End Stage Renal Disease Integumentary (Skin) Denies history of History of Burn, History of pressure wounds Musculoskeletal Patient has history of Osteoarthritis Denies history of Gout, Rheumatoid Arthritis, Osteomyelitis Neurologic Patient has history of Dementia Denies history of Neuropathy, Quadriplegia, Paraplegia, Seizure Disorder Medical And Surgical History Notes Neurologic CVA with anoxic brain injury Review of Systems (ROS) Constitutional Symptoms (General Health) Denies complaints or symptoms of Fatigue, Fever, Chills, Marked Weight Change. Respiratory Denies complaints or symptoms of Chronic or frequent coughs, Shortness of  Breath. Cardiovascular Complains or has symptoms of LE edema. Denies complaints or symptoms of Chest pain. Psychiatric Denies complaints or symptoms of Anxiety, Claustrophobia. Objective Constitutional Well-nourished and well-hydrated in no acute distress. Vitals Time Taken: 3:04 PM, Height: 68 in, Weight: 180 lbs, BMI: 27.4, Temperature: 98.4 F, Pulse: 84 bpm, Respiratory Rate: 16 breaths/min, Blood Pressure: 119/85 mmHg. Respiratory normal breathing without difficulty. clear to auscultation bilaterally. Cardiovascular regular rate and rhythm with normal S1, S2. Psychiatric this patient is able to make decisions and demonstrates good insight into disease process. Alert and Oriented x 3. pleasant and cooperative. General Notes: Patient's wound bed currently based on what I am seeing is actually on the left leg  showing signs of great epithelialization on the right leg she has a lot of improvement with regard to the size of the wound as well as the quality of the wound bed overall I am very pleased in this regard. April Powers, April Powers ([id number]) Integumentary (Hair, Skin) Wound #1 status is Open. Original cause of wound was Blister. The wound is located on the Left,Medial Lower Leg. The wound measures 5.2cm length x 5.5cm width x 0.1cm depth; 22.462cm^2 area and 2.246cm^3 volume. There is Fat Layer (Subcutaneous Tissue) Exposed exposed. There is no tunneling or undermining noted. There is a large amount of serous drainage noted. The wound margin is flat and intact. There is medium (34-66%) red granulation within the wound bed. There is a medium (34-66%) amount of necrotic tissue within the wound bed including Adherent Slough. Wound #2 status is Open. Original cause of wound was Blister. The wound is located on the Right,Anterior Lower Leg. The wound measures 0.3cm length x 0.2cm width x 0.1cm depth; 0.047cm^2 area and 0.005cm^3 volume. There is Fat Layer (Subcutaneous Tissue) Exposed  exposed. There is no tunneling or undermining noted. There is a small amount of serous drainage noted. The wound margin is flat and intact. There is large (67-100%) red granulation within the wound bed. There is a small (1-33%) amount of necrotic tissue within the wound bed including Adherent Slough. Wound #3 status is Healed - Epithelialized. Original cause of wound was Gradually Appeared. The wound is located on the Right,Proximal,Medial,Anterior Lower Leg. The wound measures 0cm length x 0cm width x 0cm depth; 0cm^2 area and 0cm^3 volume. The wound is limited to skin breakdown. There is no tunneling or undermining noted. There is a none present amount of drainage noted. The wound margin is flat and intact. There is no granulation within the wound bed. There is no necrotic tissue within the wound bed. Assessment Active Problems ICD-10 Lymphedema, not elsewhere classified Venous insufficiency (chronic) (peripheral) Non-pressure chronic ulcer of other part of left lower leg with fat layer exposed Non-pressure chronic ulcer of other part of right lower leg with fat layer exposed Vascular dementia without behavioral disturbance Essential (primary) hypertension Procedures Wound #2 Pre-procedure diagnosis of Wound #2 is a Venous Leg Ulcer located on the Right,Anterior Lower Leg .Severity of Tissue Pre Debridement is: Fat layer exposed. There was a Excisional Skin/Subcutaneous Tissue Debridement with a total area of 0.06 sq cm performed by STONE III, HOYT E., PA-C. With the following instrument(s): Curette to remove Viable and Non-Viable tissue/material. Material removed includes Eschar, Subcutaneous Tissue, and Slough after achieving pain control using Lidocaine 4% Topical Solution. No specimens were taken. A time out was conducted at 15:58, prior to the start of the procedure. A Minimum amount of bleeding was controlled with Pressure. The procedure was tolerated well with a pain level of 0  throughout and a pain level of 0 following the procedure. Post Debridement Measurements: 0.3cm length x 0.2cm width x 0.1cm depth; 0.005cm^3 volume. Character of Wound/Ulcer Post Debridement is improved. Severity of Tissue Post Debridement is: Fat layer exposed. Post procedure Diagnosis Wound #2: Same as Pre-Procedure April Powers, April Powers ([id number]) Plan Wound Cleansing: Wound #1 Left,Medial Lower Leg: May shower with protection. - Please do not get your wraps wet No tub bath. Wound #2 Right,Anterior Lower Leg: May shower with protection. - Please do not get your wraps wet No tub bath. Anesthetic (add to Medication List): Wound #1 Left,Medial Lower Leg: Topical Lidocaine 4% cream applied to wound bed prior to debridement (In Clinic  Only). Wound #2 Right,Anterior Lower Leg: Topical Lidocaine 4% cream applied to wound bed prior to debridement (In Clinic Only). Primary Wound Dressing: Wound #1 Left,Medial Lower Leg: Silver Collagen - do not wet Wound #2 Right,Anterior Lower Leg: Silver Collagen - moisten with saline Secondary Dressing: Wound #1 Left,Medial Lower Leg: XtraSorb Wound #2 Right,Anterior Lower Leg: Non-adherent pad Dressing Change Frequency: Wound #1 Left,Medial Lower Leg: Change Dressing Monday, Wednesday, Friday - Fridays at [facility] and Mondays and Wednesdays by [facility] Wound #2 Right,Anterior Lower Leg: Change Dressing Monday, Wednesday, Friday - Fridays at [facility] and Mondays and Wednesdays by [facility] Follow-up Appointments: Wound #1 Left,Medial Lower Leg: Return Appointment in 1 week. Wound #2 Right,Anterior Lower Leg: Return Appointment in 1 week. Edema Control: Wound #1 Left,Medial Lower Leg: 3 Layer Compression System - Bilateral - may anchor with unna Elevate legs to the level of the heart and pump ankles as often as possible Wound #2 Right,Anterior Lower Leg: 3 Layer Compression System - Bilateral - may anchor with unna Elevate legs to the level of the heart and  pump ankles as often as possible Home Health: Wound #1 Left,Medial Lower Leg: Continue Home Health Visits Home Health Nurse may visit PRN to address patient s wound care needs. FACE TO FACE ENCOUNTER: MEDICARE and MEDICAID PATIENTS: I certify that this patient is under my care and that I had a face-to-face encounter that meets the physician face-to-face encounter requirements with this patient on this date. The encounter with the patient was in whole or in part for the following MEDICAL CONDITION: (primary reason for Home Healthcare) MEDICAL NECESSITY: I certify, that based on my findings, NURSING services are a medically necessary home health service. HOME BOUND STATUS: I certify that my clinical findings support that this patient is homebound (i.e., Due to illness or injury, pt requires aid of supportive devices such as crutches, cane, wheelchairs, walkers, the use of special transportation or the assistance of another person to leave their place of residence. There is a normal inability to leave the home and doing so requires considerable and taxing effort. Other absences are for medical reasons / religious services and are infrequent or of short duration when for other reasons). If current dressing causes regression in wound condition, may D/C ordered dressing product/s and apply Normal Saline Moist Dressing daily until next Wound Healing Center / Other MD appointment. Notify Wound Healing Center of regression in wound condition at [phone]. Please direct any NON-WOUND related issues/requests for orders to patient's Primary Care Physician Wound #2 Right,Anterior Lower Leg: April Powers, April Powers ([id number]) Continue Home Health Visits Home Health Nurse may visit PRN to address patient s wound care needs. FACE TO FACE ENCOUNTER: MEDICARE and MEDICAID PATIENTS: I certify that this patient is under my care and that I had a face-to-face encounter that meets the physician face-to-face encounter  requirements with this patient on this date. The encounter with the patient was in whole or in part for the following MEDICAL CONDITION: (primary reason for Home Healthcare) MEDICAL NECESSITY: I certify, that based on my findings, NURSING services are a medically necessary home health service. HOME BOUND STATUS: I certify that my clinical findings support that this patient is homebound (i.e., Due to illness or injury, pt requires aid of supportive devices such as crutches, cane, wheelchairs, walkers, the use of special transportation or the assistance of another person to leave their place of residence. There is a normal inability to leave the home and doing so requires considerable and taxing  effort. Other absences are for medical reasons / religious services and are infrequent or of short duration when for other reasons). If current dressing causes regression in wound condition, may D/C ordered dressing product/s and apply Normal Saline Moist Dressing daily until next Wound Healing Center / Other MD appointment. Notify Wound Healing Center of regression in wound condition at [phone]. Please direct any NON-WOUND related issues/requests for orders to patient's Primary Care Physician 1 I would recommend currently that we actually go ahead and initiate a continuation of the current wound care measures although now that she is not having as much as edema I am going to go ahead and recommend silver collagen underneath of the XtraSorb. 2. We will continue with the 3 layer compression wraps bilaterally which also seems to be beneficial for her and will have home health change this in between the time she is here at the wound care center. We will see patient back for reevaluation in 1 week here in the clinic. If anything worsens or changes patient will contact our office for additional recommendations. Electronic Signature(s) Signed: 07/30/2019 4:33:08 PM By: Lenda Kelp PA-C Entered By: Lenda Kelp on 07/30/2019 16:33:07 April Powers ([id number]) -------------------------------------------------------------------------------- ROS/PFSH Details Patient Name: April Powers Date of Service: 07/30/2019 2:45 PM Medical Record Number: [id number] Patient Account Number: [id number] Date of Birth/Sex: Feb 11, 1928 (85 y.o. F) Treating RN: Curtis Sites Primary Care Provider: Orson Eva Other Clinician: Referring Provider: Orson Eva Treating Provider/Extender: Linwood Dibbles, HOYT Weeks in Treatment: 2 Information Obtained From Patient Constitutional Symptoms (General Health) Complaints and Symptoms: Negative for: Fatigue; Fever; Chills; Marked Weight Change Respiratory Complaints and Symptoms: Negative for: Chronic or frequent coughs; Shortness of Breath Cardiovascular Complaints and Symptoms: Positive for: LE edema Negative for: Chest pain Medical History: Positive for: Hypertension; Myocardial Infarction - 5 years ago Negative for: Angina; Arrhythmia; Congestive Heart Failure; Coronary Artery Disease; Deep Vein Thrombosis; Hypotension; Peripheral Arterial Disease; Peripheral Venous Disease; Phlebitis; Vasculitis Psychiatric Complaints and Symptoms: Negative for: Anxiety; Claustrophobia Genitourinary Medical History: Negative for: End Stage Renal Disease Integumentary (Skin) Medical History: Negative for: History of Burn; History of pressure wounds Musculoskeletal Medical History: Positive for: Osteoarthritis Negative for: Gout; Rheumatoid Arthritis; Osteomyelitis Neurologic Medical History: Positive for: Dementia Negative for: Neuropathy; Quadriplegia; Paraplegia; Seizure Disorder Past Medical History Notes: KENDY, HASTON ([id number]) CVA with anoxic brain injury Immunizations Pneumococcal Vaccine: Received Pneumococcal Vaccination: No Implantable Devices None Family and Social History Cancer: Yes - Siblings; Diabetes: Yes - Siblings; Heart  Disease: Yes - Siblings,Father; Hereditary Spherocytosis: No; Hypertension: Yes - Father,Siblings; Kidney Disease: No; Lung Disease: No; Seizures: No; Stroke: No; Thyroid Problems: No; Tuberculosis: No; Never smoker; Marital Status - Widowed; Alcohol Use: Never; Drug Use: No History; Caffeine Use: Rarely; Financial Concerns: No; Food, Clothing or Shelter Needs: No; Support System Lacking: No; Transportation Concerns: No Physician Affirmation I have reviewed and agree with the above information. Electronic Signature(s) Signed: 07/30/2019 5:17:10 PM By: Curtis Sites Signed: 07/31/2019 1:07:32 AM By: Lenda Kelp PA-C Entered By: Lenda Kelp on 07/30/2019 16:32:21 April Powers ([id number]) -------------------------------------------------------------------------------- SuperBill Details Patient Name: April Powers Date of Service: 07/30/2019 Medical Record Number: [id number] Patient Account Number: [id number] Date of Birth/Sex: 10/07/1928 (85 y.o. F) Treating RN: Curtis Sites Primary Care Provider: Orson Eva Other Clinician: Referring Provider: Orson Eva Treating Provider/Extender: Linwood Dibbles, HOYT Weeks in Treatment: 2 Diagnosis Coding ICD-10 Codes Code Description I89.0 Lymphedema, not elsewhere classified I87.2 Venous insufficiency (chronic) (peripheral) L97.822 Non-pressure chronic ulcer of other part of left  lower leg with fat layer exposed L97.812 Non-pressure chronic ulcer of other part of right lower leg with fat layer exposed F01.50 Vascular dementia without behavioral disturbance I10 Essential (primary) hypertension Facility Procedures CPT4 Code Description: 12878676 11042 - DEB SUBQ TISSUE 20 SQ CM/< ICD-10 Diagnosis Description L97.812 Non-pressure chronic ulcer of other part of right lower leg wit Modifier: h fat layer expo Quantity: 1 sed Physician Procedures CPT4 Code Description: 7209470 11042 - WC PHYS SUBQ TISS 20 SQ CM ICD-10 Diagnosis  Description L97.812 Non-pressure chronic ulcer of other part of right lower leg wit Modifier: h fat layer expo Quantity: 1 sed Electronic Signature(s) Signed: 07/30/2019 4:33:58 PM By: Lenda Kelp PA-C Entered By: Lenda Kelp on 07/30/2019 16:33:58

## 2019-08-06 ENCOUNTER — Ambulatory Visit: Payer: Medicare HMO | Admitting: Internal Medicine

## 2019-08-11 ENCOUNTER — Encounter: Payer: Medicare HMO | Attending: Internal Medicine | Admitting: Internal Medicine

## 2019-08-11 ENCOUNTER — Other Ambulatory Visit: Payer: Self-pay

## 2019-08-11 DIAGNOSIS — I890 Lymphedema, not elsewhere classified: Secondary | ICD-10-CM | POA: Diagnosis not present

## 2019-08-11 DIAGNOSIS — I252 Old myocardial infarction: Secondary | ICD-10-CM | POA: Diagnosis not present

## 2019-08-11 DIAGNOSIS — Z8673 Personal history of transient ischemic attack (TIA), and cerebral infarction without residual deficits: Secondary | ICD-10-CM | POA: Diagnosis not present

## 2019-08-11 DIAGNOSIS — L97822 Non-pressure chronic ulcer of other part of left lower leg with fat layer exposed: Secondary | ICD-10-CM | POA: Diagnosis not present

## 2019-08-11 DIAGNOSIS — I872 Venous insufficiency (chronic) (peripheral): Secondary | ICD-10-CM | POA: Diagnosis not present

## 2019-08-11 DIAGNOSIS — M1990 Unspecified osteoarthritis, unspecified site: Secondary | ICD-10-CM | POA: Diagnosis not present

## 2019-08-11 DIAGNOSIS — L97812 Non-pressure chronic ulcer of other part of right lower leg with fat layer exposed: Secondary | ICD-10-CM | POA: Diagnosis not present

## 2019-08-11 DIAGNOSIS — L97829 Non-pressure chronic ulcer of other part of left lower leg with unspecified severity: Secondary | ICD-10-CM | POA: Diagnosis present

## 2019-08-11 DIAGNOSIS — I10 Essential (primary) hypertension: Secondary | ICD-10-CM | POA: Insufficient documentation

## 2019-08-11 DIAGNOSIS — F0150 Vascular dementia without behavioral disturbance: Secondary | ICD-10-CM | POA: Diagnosis not present

## 2019-08-12 NOTE — Progress Notes (Addendum)
April, Powers ([id number]) Visit Report for 08/11/2019 Arrival Information Details Patient Name: April, Powers Date of Service: 08/11/2019 3:15 PM Medical Record Number: [id number] Patient Account Number: [id number] Date of Birth/Sex: 1927/11/11 (85 y.o. F) Treating RN: Huel Coventry Primary Care Alicia Ackert: Orson Eva Other Clinician: Referring Kabella Cassidy: Orson Eva Treating Hermenegildo Clausen/Extender: Altamese [place] in Treatment: 4 Visit Information History Since Last Visit Added or deleted any medications: No Patient Arrived: Wheel Chair Any new allergies or adverse reactions: No Arrival Time: 15:25 Had a fall or experienced change in No Accompanied By: niece, Miranda activities of daily living that may affect Transfer Assistance: Manual risk of falls: Patient Identification Verified: Yes Signs or symptoms of abuse/neglect since last visito No Secondary Verification Process Yes Hospitalized since last visit: No Completed: Implantable device outside of the clinic excluding No Patient Has Alerts: Yes cellular tissue based products placed in the center Patient Alerts: Patient on Blood since last visit: Thinner Has Dressing in Place as Prescribed: Yes Eliquis Pain Present Now: No Electronic Signature(s) Signed: 08/12/2019 10:34:41 AM By: Dayton Martes RCP, RRT, CHT Entered By: Dayton Martes on 08/11/2019 15:33:28 April Powers ([id number]) -------------------------------------------------------------------------------- Encounter Discharge Information Details Patient Name: April Powers Date of Service: 08/11/2019 3:15 PM Medical Record Number: [id number] Patient Account Number: [id number] Date of Birth/Sex: Mar 29, 1928 (85 y.o. F) Treating RN: Huel Coventry Primary Care Kylea Berrong: Orson Eva Other Clinician: Referring Sruthi Maurer: Orson Eva Treating Shimon Trowbridge/Extender: Altamese [place] in Treatment: 4 Encounter Discharge  Information Items Discharge Condition: Stable Ambulatory Status: Wheelchair Discharge Destination: Home Transportation: Private Auto Accompanied By: neice Schedule Follow-up Appointment: Yes Clinical Summary of Care: Electronic Signature(s) Signed: 08/12/2019 5:19:38 PM By: Elliot Gurney, BSN, RN, CWS, Kim RN, BSN Entered By: Elliot Gurney, BSN, RN, CWS, Kim on 08/11/2019 15:58:41 April Powers ([id number]) -------------------------------------------------------------------------------- Lower Extremity Assessment Details Patient Name: April Powers Date of Service: 08/11/2019 3:15 PM Medical Record Number: [id number] Patient Account Number: [id number] Date of Birth/Sex: 11-30-1927 (85 y.o. F) Treating RN: Arnette Norris Primary Care Markham Dumlao: Orson Eva Other Clinician: Referring Ryane Konieczny: Orson Eva Treating Eleina Jergens/Extender: Maxwell Caul Weeks in Treatment: 4 Edema Assessment Assessed: [Left: No] [Right: No] [Left: Edema] [Right: :] Calf Left: Right: Point of Measurement: 34 cm From Medial Instep 35.5 cm 34 cm Ankle Left: Right: Point of Measurement: 10 cm From Medial Instep 21 cm 25 cm Vascular Assessment Pulses: Dorsalis Pedis Palpable: [Left:Yes] [Right:Yes] Posterior Tibial Palpable: [Left:Yes] [Right:Yes] Electronic Signature(s) Signed: 08/12/2019 4:15:06 PM By: Arnette Norris Entered By: Arnette Norris on 08/11/2019 15:38:42 April Powers ([id number]) -------------------------------------------------------------------------------- Multi Wound Chart Details Patient Name: April Powers Date of Service: 08/11/2019 3:15 PM Medical Record Number: [id number] Patient Account Number: [id number] Date of Birth/Sex: 05-May-1928 (85 y.o. F) Treating RN: Huel Coventry Primary Care Aarian Cleaver: Orson Eva Other Clinician: Referring Zania Kalisz: Orson Eva Treating Tiandre Teall/Extender: Maxwell Caul Weeks in Treatment: 4 Vital Signs Height(in): 68  Pulse(bpm): Weight(lbs): 180 Blood Pressure(mmHg): 124/89 Body Mass Index(BMI): 27 Temperature(F): 98.7 Respiratory Rate 18 (breaths/min): Photos: [N/A:N/A] Wound Location: Left Lower Leg - Medial Right Lower Leg - Anterior N/A Wounding Event: Blister Blister N/A Primary Etiology: Venous Leg Ulcer Venous Leg Ulcer N/A Comorbid History: Hypertension, Myocardial Hypertension, Myocardial N/A Infarction, Osteoarthritis, Infarction, Osteoarthritis, Dementia Dementia Date Acquired: 07/02/2019 07/02/2019 N/A Weeks of Treatment: 4 4 N/A Wound Status: Open Open N/A Measurements L x W x D 2.7x2.5x0.1 0x0x0 N/A (cm) Area (cm) : 5.301 0 N/A Volume (cm) : 0.53 0 N/A % Reduction in Area: 75.70% 100.00% N/A % Reduction in Volume:  75.70% 100.00% N/A Classification: Full Thickness Without Full Thickness Without N/A Exposed Support Structures Exposed Support Structures Exudate Amount: Large None Present N/A Exudate Type: Serous N/A N/A Exudate Color: amber N/A N/A Wound Margin: Flat and Intact Flat and Intact N/A Granulation Amount: Medium (34-66%) Large (67-100%) N/A Granulation Quality: Red Red N/A Necrotic Amount: Medium (34-66%) Small (1-33%) N/A Exposed Structures: Fat Layer (Subcutaneous Fascia: No N/A Tissue) Exposed: Yes Fat Layer (Subcutaneous Fascia: No Tissue) Exposed: No Tendon: No Tendon: No Muscle: No Muscle: No April Powers, April Powers ([id number]) Joint: No Joint: No Bone: No Bone: No Epithelialization: None Large (67-100%) N/A Treatment Notes Wound #1 (Left, Medial Lower Leg) Notes silver collagen, abd, 3L(B), unna to anchor Electronic Signature(s) Signed: 08/13/2019 7:47:21 AM By: Linton Ham MD Previous Signature: 08/12/2019 5:19:38 PM Version By: Gretta Cool, BSN, RN, CWS, Kim RN, BSN Entered By: Linton Ham on 08/13/2019 07:22:40 April Powers ([id number]) -------------------------------------------------------------------------------- Multi-Disciplinary Care  Plan Details Patient Name: April Powers Date of Service: 08/11/2019 3:15 PM Medical Record Number: [id number] Patient Account Number: [id number] Date of Birth/Sex: 1928/09/10 (85 y.o. F) Treating RN: Cornell Barman Primary Care Yicel Shannon: Evern Bio Other Clinician: Referring Christopherjohn Schiele: Evern Bio Treating Ericson Nafziger/Extender: Tito Dine in Treatment: 4 Active Inactive Orientation to the Wound Care Program Nursing Diagnoses: Knowledge deficit related to the wound healing center program Goals: Patient/caregiver will verbalize understanding of the [place] Program Date Initiated: 07/12/2019 Target Resolution Date: 08/12/2019 Goal Status: Active Interventions: Provide education on orientation to the wound center Notes: Venous Leg Ulcer Nursing Diagnoses: Potential for venous Insuffiency (use before diagnosis confirmed) Goals: Patient will maintain optimal edema control Date Initiated: 07/12/2019 Target Resolution Date: 08/12/2019 Goal Status: Active Interventions: Assess peripheral edema status every visit. Treatment Activities: Therapeutic compression applied : 07/12/2019 Notes: Wound/Skin Impairment Nursing Diagnoses: Impaired tissue integrity Goals: Patient/caregiver will verbalize understanding of skin care regimen Date Initiated: 07/12/2019 Target Resolution Date: 07/12/2019 Goal Status: Active IDOLINA, MANTELL ([id number]) Ulcer/skin breakdown will have a volume reduction of 30% by week 4 Date Initiated: 07/12/2019 Target Resolution Date: 08/12/2019 Goal Status: Active Interventions: Assess ulceration(s) every visit Treatment Activities: Skin care regimen initiated : 07/12/2019 Topical wound management initiated : 07/12/2019 Notes: Electronic Signature(s) Signed: 08/12/2019 5:19:38 PM By: Gretta Cool, BSN, RN, CWS, Kim RN, BSN Entered By: Gretta Cool, BSN, RN, CWS, Kim on 08/11/2019 15:51:42 April Powers  ([id number]) -------------------------------------------------------------------------------- Pain Assessment Details Patient Name: April Powers Date of Service: 08/11/2019 3:15 PM Medical Record Number: [id number] Patient Account Number: [id number] Date of Birth/Sex: March 30, 1928 (85 y.o. F) Treating RN: Cornell Barman Primary Care Mikala Podoll: Evern Bio Other Clinician: Referring Xayvion Shirah: Evern Bio Treating Deyani Hegarty/Extender: Tito Dine in Treatment: 4 Active Problems Location of Pain Severity and Description of Pain Patient Has Paino No Site Locations Pain Management and Medication Current Pain Management: Electronic Signature(s) Signed: 08/12/2019 10:34:41 AM By: Lorine Bears RCP, RRT, CHT Signed: 08/12/2019 5:19:38 PM By: Gretta Cool, BSN, RN, CWS, Kim RN, BSN Entered By: Lorine Bears on 08/11/2019 15:33:46 April Powers ([id number]) -------------------------------------------------------------------------------- Patient/Caregiver Education Details Patient Name: April Powers Date of Service: 08/11/2019 3:15 PM Medical Record Number: [id number] Patient Account Number: [id number] Date of Birth/Gender: 04-Mar-1928 (85 y.o. F) Treating RN: Cornell Barman Primary Care Physician: Evern Bio Other Clinician: Referring Physician: Evern Bio Treating Physician/Extender: Tito Dine in Treatment: 4 Education Assessment Education Provided To: Patient Education Topics Provided Wound/Skin Impairment: Handouts: Caring for Your Ulcer Methods: Demonstration, Explain/Verbal Responses: State content correctly Electronic Signature(s) Signed: 08/12/2019 5:19:38 PM By: Gretta Cool, BSN,  RN, CWS, Kim RN, BSN Entered By: Elliot GurneyWoody, BSN, RN, CWS, Kim on 08/11/2019 15:55:06 April Powers, Braylie ([id number]) -------------------------------------------------------------------------------- Wound Assessment Details Patient Name: April Powers, April Powers Date  of Service: 08/11/2019 3:15 PM Medical Record Number: [id number] Patient Account Number: [id number] Date of Birth/Sex: 14-Aug-1928 (85 y.o. F) Treating RN: Arnette NorrisBiell, Kristina Primary Care Mavryk Pino: Orson EvaBOSWELL, CHELSA Other Clinician: Referring Ved Martos: Orson EvaBOSWELL, CHELSA Treating Devaney Segers/Extender: Maxwell CaulOBSON, MICHAEL G Weeks in Treatment: 4 Wound Status Wound Number: 1 Primary Venous Leg Ulcer Etiology: Wound Location: Left Lower Leg - Medial Wound Status: Open Wounding Event: Blister Comorbid Hypertension, Myocardial Infarction, Date Acquired: 07/02/2019 History: Osteoarthritis, Dementia Weeks Of Treatment: 4 Clustered Wound: No Photos Wound Measurements Length: (cm) 2.7 Width: (cm) 2.5 Depth: (cm) 0.1 Area: (cm) 5.301 Volume: (cm) 0.53 % Reduction in Area: 75.7% % Reduction in Volume: 75.7% Epithelialization: None Tunneling: No Undermining: No Wound Description Full Thickness Without Exposed Support Foul Classification: Structures Sloug Wound Margin: Flat and Intact Exudate Large Amount: Exudate Type: Serous Exudate Color: amber Odor After Cleansing: No h/Fibrino Yes Wound Bed Granulation Amount: Medium (34-66%) Exposed Structure Granulation Quality: Red Fascia Exposed: No Necrotic Amount: Medium (34-66%) Fat Layer (Subcutaneous Tissue) Exposed: Yes Necrotic Quality: Adherent Slough Tendon Exposed: No Muscle Exposed: No Joint Exposed: No Bone Exposed: No Dinan, Dorrie ([id number]) Treatment Notes Wound #1 (Left, Medial Lower Leg) Notes silver collagen, abd, 3L(B), unna to anchor Electronic Signature(s) Signed: 08/12/2019 4:15:06 PM By: Arnette NorrisBiell, Kristina Entered By: Arnette NorrisBiell, Kristina on 08/11/2019 15:42:12 April Powers, April Powers ([id number]) -------------------------------------------------------------------------------- Wound Assessment Details Patient Name: April Powers, Keishana Date of Service: 08/11/2019 3:15 PM Medical Record Number: [id number] Patient Account Number:  [id number] Date of Birth/Sex: 14-Aug-1928 (85 y.o. F) Treating RN: Arnette NorrisBiell, Kristina Primary Care Maleena Eddleman: Orson EvaBOSWELL, CHELSA Other Clinician: Referring Rodrigus Kilker: Orson EvaBOSWELL, CHELSA Treating Bryann Mcnealy/Extender: Maxwell CaulOBSON, MICHAEL G Weeks in Treatment: 4 Wound Status Wound Number: 2 Primary Venous Leg Ulcer Etiology: Wound Location: Right Lower Leg - Anterior Wound Status: Open Wounding Event: Blister Comorbid Hypertension, Myocardial Infarction, Date Acquired: 07/02/2019 History: Osteoarthritis, Dementia Weeks Of Treatment: 4 Clustered Wound: No Photos Wound Measurements Length: (cm) 0 % Redu Width: (cm) 0 % Redu Depth: (cm) 0 Epithe Area: (cm) 0 Tunne Volume: (cm) 0 Under ction in Area: 100% ction in Volume: 100% lialization: Large (67-100%) ling: No mining: No Wound Description Full Thickness Without Exposed Support Classification: Structures Wound Margin: Flat and Intact Exudate None Present Amount: Foul Odor After Cleansing: No Slough/Fibrino No Wound Bed Granulation Amount: Large (67-100%) Exposed Structure Granulation Quality: Red Fascia Exposed: No Necrotic Amount: Small (1-33%) Fat Layer (Subcutaneous Tissue) Exposed: No Necrotic Quality: Adherent Slough Tendon Exposed: No Muscle Exposed: No Joint Exposed: No Bone Exposed: No Electronic Signature(s) Signed: 08/12/2019 4:15:06 PM By: Lurena NidaBiell, Kristina April Powers, April Powers RhodesBETTY ([id number]) Entered By: Arnette NorrisBiell, Kristina on 08/11/2019 15:43:10 April Powers, April Powers ([id number]) -------------------------------------------------------------------------------- Vitals Details Patient Name: April Powers, April Powers Date of Service: 08/11/2019 3:15 PM Medical Record Number: [id number] Patient Account Number: [id number] Date of Birth/Sex: 14-Aug-1928 (85 y.o. F) Treating RN: Arnette NorrisBiell, Kristina Primary Care Urban Naval: Orson EvaBOSWELL, CHELSA Other Clinician: Referring Darick Fetters: Orson EvaBOSWELL, CHELSA Treating Cordarrell Sane/Extender: Altamese CarolinaOBSON, MICHAEL G Weeks in Treatment:  4 Vital Signs Time Taken: 15:30 Temperature (F): 98.7 Height (in): 68 Respiratory Rate (breaths/min): 18 Weight (lbs): 180 Blood Pressure (mmHg): 124/89 Body Mass Index (BMI): 27.4 Reference Range: 80 - 120 mg / dl Electronic Signature(s) Signed: 08/12/2019 4:15:06 PM By: Arnette NorrisBiell, Kristina Entered By: Arnette NorrisBiell, Kristina on 08/11/2019 15:35:52

## 2019-08-13 NOTE — Progress Notes (Signed)
April Powers, April Powers ([id number]) Visit Report for 08/11/2019 HPI Details Patient Name: April Powers, April Powers Date of Service: 08/11/2019 3:15 PM Medical Record Number: [id number] Patient Account Number: [id number] Date of Birth/Sex: 1928/08/05 (85 y.o. F) Treating RN: Huel Coventry Primary Care Provider: Orson Eva Other Clinician: Referring Provider: Orson Eva Treating Provider/Extender: Altamese [place] in Treatment: 4 History of Present Illness HPI Description: 07/12/2019 on evaluation today patient appears for initial evaluation here in our clinic today concerning an issue that is been happening with her bilateral lower extremities and this began about 10 days ago. She does have bilateral lower extremity edema secondary to venous stasis. This does appear to be stage I lymphedema most likely. With that being said there is no signs of active infection at this time which is good news. That was one concern of her sister was that the patient may have an infection and required antibiotic. The good news is that does not seem to be the case. With that being said I do think the patient needs good edema control at this point. She has previously had compression stockings but is been quite some time since she has worn them according to her sister who appears to be her primary caregiver and who is with her today. The patient has had a stroke and really does not talk much though fortunately other than the stroke does not appear to have a significant past medical history other than hypertension and dementia. No sharp debridement is good to be required today which is excellent news. With that being said I do believe that compression is good to be of utmost importance as far as getting these wounds to heal as well as keeping new areas from occurring. 07/20/2019 on evaluation today patient presents for follow-up concerning ongoing issues with her bilateral lower extremities. Fortunately there is no signs  of active infection at this time. With that being said she still has several open wounds that are draining quite significantly at this point. There is no signs of systemic infection. No fevers, chills, nausea, vomiting, or diarrhea. The patient does have significant dementia and has very hard time getting around here in the clinic today I did advise her niece who is with her at this point that in the future she is going to need to use a wheelchair when here at the clinic in order to prevent any additional issues such as injury while she is here on the premises. I understand I do want her to try to walk but I think she needs to do so with a walker and in her more comfortable home environment where again she is more familiar with the surroundings. Patient's family member voiced understanding. 07/30/2019 upon evaluation today patient appears to be doing very well with regard to her lower extremity ulcers. She has been tolerating the dressing changes without complication. I do feel like overall she is making significant improvement which is great news. There does not appear to be any signs of active infection also great news. Overall I am very pleased with the fact that her wounds are measuring much smaller and there is a lot of new epithelization noted at this point. 11/4; this is a very frail woman that have not seen previously. She probably has venous ulcerations with secondary lymphedema. She does not have an open area on the right leg she does have a small area on the left anterior tibia and a superficial larger area on the left lateral calf. Her wounds appear to have  been making good progress. Electronic Signature(s) Signed: 08/13/2019 7:47:21 AM By: Baltazar Najjar MD Entered By: Baltazar Najjar on 08/13/2019 07:24:08 April Powers ([id number]) -------------------------------------------------------------------------------- Physical Exam Details Patient Name: April Powers Date of Service:  08/11/2019 3:15 PM Medical Record Number: [id number] Patient Account Number: [id number] Date of Birth/Sex: [birth year] (85 y.o. F) Treating RN: Huel Coventry Primary Care Provider: Orson Eva Other Clinician: Referring Provider: Orson Eva Treating Provider/Extender: Maxwell Caul Weeks in Treatment: 4 Constitutional Sitting or standing Blood Pressure is within target range for patient.. Pulse regular and within target range for patient.Marland Kitchen Respirations regular, non-labored and within target range.. Temperature is normal and within the target range for the patient.Marland Kitchen appears in no distress. Eyes Conjunctivae clear. No discharge. Respiratory Respiratory effort is easy and symmetric bilaterally. Rate is normal at rest and on room air.. Cardiovascular Pedal pulses palpable and strong bilaterally.. Some nonpitting edema. Musculoskeletal Has remarkable difference coming to a standing position very difficult to transfer. Integumentary (Hair, Skin) Probable lymphedema stage II and most likely secondary to chronic venous insufficiency. Neurological No increased tone extraocular movements normal. Psychiatric No obvious acute abnormalities in mental status. Notes Wound exam; the right leg appears to be closed. On the left there is a small open area on the anterior tibia and her more superficial larger area on the left lateral calf. Both of these appear to be making good progress. There is no evidence of infection. No contributing evidence of PAD Electronic Signature(s) Signed: 08/13/2019 7:47:21 AM By: Baltazar Najjar MD Entered By: Baltazar Najjar on 08/13/2019 07:27:08 April Powers ([id number]) -------------------------------------------------------------------------------- Physician Orders Details Patient Name: April Powers Date of Service: 08/11/2019 3:15 PM Medical Record Number: [id number] Patient Account Number: [id number] Date of Birth/Sex: January 11, 1928 (85 y.o. F) Treating  RN: Huel Coventry Primary Care Provider: Orson Eva Other Clinician: Referring Provider: Orson Eva Treating Provider/Extender: Altamese [place] in Treatment: 4 Verbal / Phone Orders: No Diagnosis Coding Wound Cleansing Wound #1 Left,Medial Lower Leg o May shower with protection. - Please do not get your wraps wet o No tub bath. Anesthetic (add to Medication List) Wound #1 Left,Medial Lower Leg o Topical Lidocaine 4% cream applied to wound bed prior to debridement (In Clinic Only). Primary Wound Dressing Wound #1 Left,Medial Lower Leg o Silver Collagen - do not wet Secondary Dressing Wound #1 Left,Medial Lower Leg o XtraSorb Dressing Change Frequency Wound #1 Left,Medial Lower Leg o Change Dressing Monday, Wednesday, Friday - Fridays at [facility] and Mondays and Wednesdays by [facility] Follow-up Appointments Wound #1 Left,Medial Lower Leg o Return Appointment in 1 week. Edema Control Wound #1 Left,Medial Lower Leg o 3 Layer Compression System - Bilateral - may anchor with unna o Elevate legs to the level of the heart and pump ankles as often as possible Home Health Wound #1 Left,Medial Lower Leg o Continue Home Health Visits o Home Health Nurse may visit PRN to address patientos wound care needs. o FACE TO FACE ENCOUNTER: MEDICARE and MEDICAID PATIENTS: I certify that this patient is under my care and that I had a face-to-face encounter that meets the physician face-to-face encounter requirements with this patient on this date. The encounter with the patient was in whole or in part for the following MEDICAL CONDITION: (primary reason for Home Healthcare) MEDICAL NECESSITY: I certify, that based on my findings, NURSING services are a medically necessary home health service. HOME BOUND STATUS: I certify that my clinical findings support that this patient is homebound (i.e., Due to illness or injury, pt  requires aid of supportive devices such as  crutches, cane, wheelchairs, walkers, the use of special transportation or the assistance of another person to leave their place of residence. There is a normal inability to leave the home Boyd, Kortny ([id number]) and doing so requires considerable and taxing effort. Other absences are for medical reasons / religious services and are infrequent or of short duration when for other reasons). o If current dressing causes regression in wound condition, may D/C ordered dressing product/s and apply Normal Saline Moist Dressing daily until next Wound Healing Center / Other MD appointment. Notify Wound Healing Center of regression in wound condition at [phone]. o Please direct any NON-WOUND related issues/requests for orders to patient's Primary Care Physician Electronic Signature(s) Signed: 08/12/2019 5:19:38 PM By: Elliot Gurney, BSN, RN, CWS, Kim RN, BSN Signed: 08/13/2019 7:47:21 AM By: Baltazar Najjar MD Entered By: Elliot Gurney, BSN, RN, CWS, Kim on 08/11/2019 15:57:31 April Powers ([id number]) -------------------------------------------------------------------------------- Problem List Details Patient Name: April Powers Date of Service: 08/11/2019 3:15 PM Medical Record Number: [id number] Patient Account Number: [id number] Date of Birth/Sex: November 29, 1927 (85 y.o. F) Treating RN: Huel Coventry Primary Care Provider: Orson Eva Other Clinician: Referring Provider: Orson Eva Treating Provider/Extender: Altamese [place] in Treatment: 4 Active Problems ICD-10 Evaluated Encounter Code Description Active Date Today Diagnosis I89.0 Lymphedema, not elsewhere classified 07/12/2019 No Yes I87.2 Venous insufficiency (chronic) (peripheral) 07/12/2019 No Yes L97.822 Non-pressure chronic ulcer of other part of left lower leg with 07/12/2019 No Yes fat layer exposed L97.812 Non-pressure chronic ulcer of other part of right lower leg 07/12/2019 No Yes with fat layer exposed F01.50 Vascular  dementia without behavioral disturbance 07/12/2019 No Yes I10 Essential (primary) hypertension 07/12/2019 No Yes Inactive Problems Resolved Problems Electronic Signature(s) Signed: 08/13/2019 7:47:21 AM By: Baltazar Najjar MD Entered By: Baltazar Najjar on 08/13/2019 07:22:19 April Powers ([id number]) -------------------------------------------------------------------------------- Progress Note Details Patient Name: April Powers Date of Service: 08/11/2019 3:15 PM Medical Record Number: [id number] Patient Account Number: [id number] Date of Birth/Sex: 09/29/1928 (85 y.o. F) Treating RN: Huel Coventry Primary Care Provider: Orson Eva Other Clinician: Referring Provider: Orson Eva Treating Provider/Extender: Altamese [place] in Treatment: 4 Subjective History of Present Illness (HPI) 07/12/2019 on evaluation today patient appears for initial evaluation here in our clinic today concerning an issue that is been happening with her bilateral lower extremities and this began about 10 days ago. She does have bilateral lower extremity edema secondary to venous stasis. This does appear to be stage I lymphedema most likely. With that being said there is no signs of active infection at this time which is good news. That was one concern of her sister was that the patient may have an infection and required antibiotic. The good news is that does not seem to be the case. With that being said I do think the patient needs good edema control at this point. She has previously had compression stockings but is been quite some time since she has worn them according to her sister who appears to be her primary caregiver and who is with her today. The patient has had a stroke and really does not talk much though fortunately other than the stroke does not appear to have a significant past medical history other than hypertension and dementia. No sharp debridement is good to be required today which is  excellent news. With that being said I do believe that compression is good to be of utmost importance as far as getting these wounds to heal as well  as keeping new areas from occurring. 07/20/2019 on evaluation today patient presents for follow-up concerning ongoing issues with her bilateral lower extremities. Fortunately there is no signs of active infection at this time. With that being said she still has several open wounds that are draining quite significantly at this point. There is no signs of systemic infection. No fevers, chills, nausea, vomiting, or diarrhea. The patient does have significant dementia and has very hard time getting around here in the clinic today I did advise her niece who is with her at this point that in the future she is going to need to use a wheelchair when here at the clinic in order to prevent any additional issues such as injury while she is here on the premises. I understand I do want her to try to walk but I think she needs to do so with a walker and in her more comfortable home environment where again she is more familiar with the surroundings. Patient's family member voiced understanding. 07/30/2019 upon evaluation today patient appears to be doing very well with regard to her lower extremity ulcers. She has been tolerating the dressing changes without complication. I do feel like overall she is making significant improvement which is great news. There does not appear to be any signs of active infection also great news. Overall I am very pleased with the fact that her wounds are measuring much smaller and there is a lot of new epithelization noted at this point. 11/4; this is a very frail woman that have not seen previously. She probably has venous ulcerations with secondary lymphedema. She does not have an open area on the right leg she does have a small area on the left anterior tibia and a superficial larger area on the left lateral calf. Her wounds appear to  have been making good progress. Objective Constitutional Sitting or standing Blood Pressure is within target range for patient.. Pulse regular and within target range for patient.Marland Kitchen Respirations regular, non-labored and within target range.. Temperature is normal and within the target range for the patient.Marland Kitchen appears in no distress. Vitals Time Taken: 3:30 PM, Height: 68 in, Weight: 180 lbs, BMI: 27.4, Temperature: 98.7 F, Respiratory Rate: 18 breaths/min, Blood Pressure: 124/89 mmHg. April Powers, April Powers ([id number]) Eyes Conjunctivae clear. No discharge. Respiratory Respiratory effort is easy and symmetric bilaterally. Rate is normal at rest and on room air.. Cardiovascular Pedal pulses palpable and strong bilaterally.. Some nonpitting edema. Musculoskeletal Has remarkable difference coming to a standing position very difficult to transfer. Neurological No increased tone extraocular movements normal. Psychiatric No obvious acute abnormalities in mental status. General Notes: Wound exam; the right leg appears to be closed. On the left there is a small open area on the anterior tibia and her more superficial larger area on the left lateral calf. Both of these appear to be making good progress. There is no evidence of infection. No contributing evidence of PAD Integumentary (Hair, Skin) Probable lymphedema stage II and most likely secondary to chronic venous insufficiency. Wound #1 status is Open. Original cause of wound was Blister. The wound is located on the Left,Medial Lower Leg. The wound measures 2.7cm length x 2.5cm width x 0.1cm depth; 5.301cm^2 area and 0.53cm^3 volume. There is Fat Layer (Subcutaneous Tissue) Exposed exposed. There is no tunneling or undermining noted. There is a large amount of serous drainage noted. The wound margin is flat and intact. There is medium (34-66%) red granulation within the wound bed. There is a medium (34-66%) amount of  necrotic tissue within the  wound bed including Adherent Slough. Wound #2 status is Open. Original cause of wound was Blister. The wound is located on the Right,Anterior Lower Leg. The wound measures 0cm length x 0cm width x 0cm depth; 0cm^2 area and 0cm^3 volume. There is no tunneling or undermining noted. There is a none present amount of drainage noted. The wound margin is flat and intact. There is large (67-100%) red granulation within the wound bed. There is a small (1-33%) amount of necrotic tissue within the wound bed including Adherent Slough. Assessment Active Problems ICD-10 Lymphedema, not elsewhere classified Venous insufficiency (chronic) (peripheral) Non-pressure chronic ulcer of other part of left lower leg with fat layer exposed Non-pressure chronic ulcer of other part of right lower leg with fat layer exposed Vascular dementia without behavioral disturbance Essential (primary) hypertension Tesmer, Odilia ([id number]) Plan Wound Cleansing: Wound #1 Left,Medial Lower Leg: May shower with protection. - Please do not get your wraps wet No tub bath. Anesthetic (add to Medication List): Wound #1 Left,Medial Lower Leg: Topical Lidocaine 4% cream applied to wound bed prior to debridement (In Clinic Only). Primary Wound Dressing: Wound #1 Left,Medial Lower Leg: Silver Collagen - do not wet Secondary Dressing: Wound #1 Left,Medial Lower Leg: XtraSorb Dressing Change Frequency: Wound #1 Left,Medial Lower Leg: Change Dressing Monday, Wednesday, Friday - Fridays at [facility] and Mondays and Wednesdays by [facility] Follow-up Appointments: Wound #1 Left,Medial Lower Leg: Return Appointment in 1 week. Edema Control: Wound #1 Left,Medial Lower Leg: 3 Layer Compression System - Bilateral - may anchor with unna Elevate legs to the level of the heart and pump ankles as often as possible Home Health: Wound #1 Left,Medial Lower Leg: Continue Home Health Visits Home Health Nurse may visit PRN to address patient s  wound care needs. FACE TO FACE ENCOUNTER: MEDICARE and MEDICAID PATIENTS: I certify that this patient is under my care and that I had a face-to-face encounter that meets the physician face-to-face encounter requirements with this patient on this date. The encounter with the patient was in whole or in part for the following MEDICAL CONDITION: (primary reason for Home Healthcare) MEDICAL NECESSITY: I certify, that based on my findings, NURSING services are a medically necessary home health service. HOME BOUND STATUS: I certify that my clinical findings support that this patient is homebound (i.e., Due to illness or injury, pt requires aid of supportive devices such as crutches, cane, wheelchairs, walkers, the use of special transportation or the assistance of another person to leave their place of residence. There is a normal inability to leave the home and doing so requires considerable and taxing effort. Other absences are for medical reasons / religious services and are infrequent or of short duration when for other reasons). If current dressing causes regression in wound condition, may D/C ordered dressing product/s and apply Normal Saline Moist Dressing daily until next Wound Healing Center / Other MD appointment. Notify Wound Healing Center of regression in wound condition at [phone]. Please direct any NON-WOUND related issues/requests for orders to patient's Primary Care Physician 1. From what I am able to determine we are making excellent progress patient's initially bilateral lower extremity wounds. The only ones left were on the left anterior left medial. Even these appear to be making good gains. I did not change the wound care dressing 2. There was concern raised by both our staff and the patient's family member who came with her about deteriorating gait and function over the last month. I witnessed  the fact that she was a difficult transfer in fact I had the support most of this.  Initially she looked somewhat parkinsonian to me I did a quick screen of this I could not find any evidence of this. I urged the family member to make the primary doctor aware that she needs to be seen and possibly referred too A neurologist. I would also think she would benefit from in-home therapy but she seems to already have this arranged Electronic Signature(s) April Powers, April Powers ([id number]) Signed: 08/13/2019 7:47:21 AM By: Baltazar Najjarobson, Elvan Ebron MD Entered By: Baltazar Najjarobson, Darin Redmann on 08/13/2019 40:98:1107:29:24 April Powers, April Powers ([id number]) -------------------------------------------------------------------------------- SuperBill Details Patient Name: April Powers, April Powers Date of Service: 08/11/2019 Medical Record Number: [id number] Patient Account Number: [id number] Date of Birth/Sex: 05-19-1928 (85 y.o. F) Treating RN: Huel CoventryWoody, Kim Primary Care Provider: Orson EvaBOSWELL, CHELSA Other Clinician: Referring Provider: Orson EvaBOSWELL, CHELSA Treating Provider/Extender: Altamese CarolinaOBSON, Talbert Trembath G Weeks in Treatment: 4 Diagnosis Coding ICD-10 Codes Code Description I89.0 Lymphedema, not elsewhere classified I87.2 Venous insufficiency (chronic) (peripheral) L97.822 Non-pressure chronic ulcer of other part of left lower leg with fat layer exposed L97.812 Non-pressure chronic ulcer of other part of right lower leg with fat layer exposed F01.50 Vascular dementia without behavioral disturbance I10 Essential (primary) hypertension Facility Procedures CPT4: Description Modifier Quantity Code [id number] BILATERAL: Application of multi-layer venous compression system; leg (below 1 knee), including ankle and foot. Physician Procedures CPT4 Code Description: [id number] - WC PHYS LEVEL 3 - EST PT ICD-10 Diagnosis Description L97.822 Non-pressure chronic ulcer of other part of left lower leg wit I87.2 Venous insufficiency (chronic) (peripheral) I89.0 Lymphedema, not elsewhere  classified Modifier: h fat layer expose Quantity: 1 d Electronic  Signature(s) Signed: 08/13/2019 7:47:21 AM By: Baltazar Najjarobson, Alvera Tourigny MD Previous Signature: 08/12/2019 5:19:38 PM Version By: Elliot GurneyWoody, BSN, RN, CWS, Kim RN, BSN Entered By: Baltazar Najjarobson, Valery Amedee on 08/13/2019 07:29:46

## 2019-08-19 ENCOUNTER — Ambulatory Visit: Payer: Medicare HMO | Admitting: Physician Assistant

## 2019-08-20 ENCOUNTER — Ambulatory Visit: Payer: Medicare HMO | Admitting: Physician Assistant

## 2019-08-24 ENCOUNTER — Ambulatory Visit (LOCAL_COMMUNITY_HEALTH_CENTER): Payer: Self-pay

## 2019-08-24 ENCOUNTER — Other Ambulatory Visit: Payer: Self-pay

## 2019-08-24 DIAGNOSIS — Z111 Encounter for screening for respiratory tuberculosis: Secondary | ICD-10-CM

## 2019-08-24 NOTE — Progress Notes (Signed)
Accompanied throughout appt by adult female. Rich Number, RN

## 2019-08-27 ENCOUNTER — Other Ambulatory Visit: Payer: Medicare HMO

## 2019-08-27 ENCOUNTER — Ambulatory Visit: Payer: Medicare HMO | Admitting: Physician Assistant

## 2019-08-27 ENCOUNTER — Other Ambulatory Visit: Payer: Self-pay

## 2019-08-27 ENCOUNTER — Ambulatory Visit (LOCAL_COMMUNITY_HEALTH_CENTER): Payer: Medicare HMO

## 2019-08-27 DIAGNOSIS — Z111 Encounter for screening for respiratory tuberculosis: Secondary | ICD-10-CM

## 2019-08-27 LAB — TB SKIN TEST
Induration: 0 mm
TB Skin Test: NEGATIVE

## 2019-08-30 ENCOUNTER — Encounter: Payer: Medicare HMO | Admitting: Physician Assistant

## 2019-08-30 DIAGNOSIS — L97822 Non-pressure chronic ulcer of other part of left lower leg with fat layer exposed: Secondary | ICD-10-CM | POA: Diagnosis not present

## 2019-08-31 ENCOUNTER — Other Ambulatory Visit: Payer: Self-pay

## 2019-08-31 DIAGNOSIS — Z20822 Contact with and (suspected) exposure to covid-19: Secondary | ICD-10-CM

## 2019-09-01 ENCOUNTER — Other Ambulatory Visit: Payer: Self-pay | Admitting: Nurse Practitioner

## 2019-09-01 DIAGNOSIS — M79605 Pain in left leg: Secondary | ICD-10-CM

## 2019-09-01 DIAGNOSIS — M79604 Pain in right leg: Secondary | ICD-10-CM

## 2019-09-02 LAB — NOVEL CORONAVIRUS, NAA: SARS-CoV-2, NAA: NOT DETECTED

## 2019-09-02 NOTE — Progress Notes (Signed)
April Powers ([id number]) Visit Report for 08/30/2019 Arrival Information Details Patient Name: April Powers, April Powers Date of Service: 08/30/2019 4:45 PM Medical Record Number: [id number] Patient Account Number: [id number] Date of Birth/Sex: Apr 07, 1928 (85 y.o. F) Treating RN: Huel Coventry Primary Care Chantelle Verdi: Orson Eva Other Clinician: Referring Sammy Douthitt: Orson Eva Treating Lariza Cothron/Extender: Linwood Dibbles, HOYT Weeks in Treatment: 7 Visit Information History Since Last Visit Added or deleted any medications: No Patient Arrived: Wheel Chair Any new allergies or adverse reactions: No Arrival Time: 16:31 Had a fall or experienced change in No Accompanied By: caregiver activities of daily living that may affect Transfer Assistance: Manual risk of falls: Patient Identification Verified: Yes Signs or symptoms of abuse/neglect since No Secondary Verification Process Yes last visito Completed: Hospitalized since last visit: No Patient Has Alerts: Yes Implantable device outside of the clinic No Patient Alerts: Patient on Blood excluding Thinner cellular tissue based products placed in the [facility] since last visit: Has Dressing in Place as Prescribed: Yes Pain Present Now: Unable to Respond Electronic Signature(s) Signed: 09/01/2019 5:12:33 PM By: Elliot Gurney, BSN, RN, CWS, Kim RN, BSN Entered By: Elliot Gurney, BSN, RN, CWS, Kim on 08/30/2019 16:39:56 April Powers ([id number]) -------------------------------------------------------------------------------- Clinic Level of Care Assessment Details Patient Name: April Powers Date of Service: 08/30/2019 4:45 PM Medical Record Number: [id number] Patient Account Number: [id number] Date of Birth/Sex: [birth year] (85 y.o. F) Treating RN: Huel Coventry Primary Care Standley Bargo: Orson Eva Other Clinician: Referring Staphany Ditton: Orson Eva Treating Ardit Danh/Extender: Linwood Dibbles, HOYT Weeks in Treatment: 7 Clinic Level of Care  Assessment Items TOOL 4 Quantity Score []  - Use when only an EandM is performed on FOLLOW-UP visit 0 ASSESSMENTS - Nursing Assessment / Reassessment X - Reassessment of Co-morbidities (includes updates in patient status) 1 10 X- 1 5 Reassessment of Adherence to Treatment Plan ASSESSMENTS - Wound and Skin Assessment / Reassessment []  - Simple Wound Assessment / Reassessment - one wound 0 X- 2 5 Complex Wound Assessment / Reassessment - multiple wounds []  - 0 Dermatologic / Skin Assessment (not related to wound area) ASSESSMENTS - Focused Assessment []  - Circumferential Edema Measurements - multi extremities 0 []  - 0 Nutritional Assessment / Counseling / Intervention []  - 0 Lower Extremity Assessment (monofilament, tuning fork, pulses) []  - 0 Peripheral Arterial Disease Assessment (using hand held doppler) ASSESSMENTS - Ostomy and/or Continence Assessment and Care []  - Incontinence Assessment and Management 0 []  - 0 Ostomy Care Assessment and Management (repouching, etc.) PROCESS - Coordination of Care X - Simple Patient / Family Education for ongoing care 1 15 []  - 0 Complex (extensive) Patient / Family Education for ongoing care []  - 0 Staff obtains , Records, Test Results / Process Orders X- 1 10 Staff telephones HHA, Nursing Homes / Clarify orders / etc []  - 0 Routine Transfer to another Facility (non-emergent condition) []  - 0 Routine Hospital Admission (non-emergent condition) []  - 0 New Admissions / / Ordering NPWT, Apligraf, etc. []  - 0 Emergency Hospital Admission (emergent condition) X- 1 10 Simple Discharge Coordination April Powers, April Powers ( ) []  - 0 Complex (extensive) Discharge Coordination PROCESS - Special Needs []  - Pediatric / Minor Patient Management 0 []  - 0 Isolation Patient Management []  - 0 Hearing / Language / Visual special needs []  - 0 Assessment of Community assistance (transportation, D/C planning,  etc.) []  - 0 Additional assistance / Altered mentation []  - 0 Support Surface(s) Assessment (bed, cushion, seat, etc.) INTERVENTIONS - Wound Cleansing / Measurement []  - Simple Wound Cleansing - one wound  0 X- 2 5 Complex Wound Cleansing - multiple wounds X- 1 5 Wound Imaging (photographs - any number of wounds)  - 0 Wound Tracing (instead of photographs)  - 0 Simple Wound Measurement - one wound X- 2 5 Complex Wound Measurement - multiple wounds INTERVENTIONS - Wound Dressings X - Small Wound Dressing one or multiple wounds 1 10  - 0 Medium Wound Dressing one or multiple wounds  - 0 Large Wound Dressing one or multiple wounds  - 0 Application of Medications - topical  - 0 Application of Medications - injection INTERVENTIONS - Miscellaneous  - External ear exam 0  - 0 Specimen Collection (cultures, biopsies, blood, body fluids, etc.)  - 0 Specimen(s) / Culture(s) sent or taken to Lab for analysis  - 0 Patient Transfer (multiple staff / Nurse, adult / Similar devices)  - 0 Simple Staple / Suture removal (25 or less)  - 0 Complex Staple / Suture removal (26 or more)  - 0 Hypo / Hyperglycemic Management (close monitor of Blood Glucose)  - 0 Ankle / Brachial Index (ABI) - do not check if billed separately X- 1 5 Vital Signs April Powers ([id number]) Has the patient been seen at the hospital within the last three years: Yes Total Score: 100 Level Of Care: New/Established - Level 3 Electronic Signature(s) Signed: 09/01/2019 5:12:33 PM By: Elliot Gurney, BSN, RN, CWS, Kim RN, BSN Entered By: Elliot Gurney, BSN, RN, CWS, Kim on 08/30/2019 17:16:56 April Powers ([id number]) -------------------------------------------------------------------------------- Lower Extremity Assessment Details Patient Name: April Powers Date of Service: 08/30/2019 4:45 PM Medical Record Number: [id number] Patient Account Number: [id number] Date of Birth/Sex: May 15, 1928 (85  y.o. F) Treating RN: Huel Coventry Primary Care Dorell Gatlin: Orson Eva Other Clinician: Referring Fernandez Kenley: Orson Eva Treating Rafia Shedden/Extender: Linwood Dibbles, HOYT Weeks in Treatment: 7 Edema Assessment Assessed: [Left: No] [Right: No] [Left: Edema] [Right: :] Calf Left: Right: Point of Measurement: 34 cm From Medial Instep 32.6 cm cm Ankle Left: Right: Point of Measurement: 10 cm From Medial Instep 22 cm cm Vascular Assessment Pulses: Dorsalis Pedis Palpable: [Left:Yes Yes] Electronic Signature(s) Signed: 09/01/2019 5:12:33 PM By: Elliot Gurney, BSN, RN, CWS, Kim RN, BSN Entered By: Elliot Gurney, BSN, RN, CWS, Kim on 08/30/2019 16:43:54 April Powers ([id number]) -------------------------------------------------------------------------------- Multi Wound Chart Details Patient Name: April Powers Date of Service: 08/30/2019 4:45 PM Medical Record Number: [id number] Patient Account Number: [id number] Date of Birth/Sex: 09/19/1928 (85 y.o. F) Treating RN: Huel Coventry Primary Care Cahterine Heinzel: Orson Eva Other Clinician: Referring Lancer Thurner: Orson Eva Treating Tavon Corriher/Extender: STONE III, HOYT Weeks in Treatment: 7 Vital Signs Height(in): 68 Pulse(bpm): 140 Weight(lbs): 180 Blood Pressure(mmHg): 129/98 Body Mass Index(BMI): 27 Temperature(F): 97.5 Respiratory Rate 18 (breaths/min): Photos: [N/A:N/A] Wound Location: Left Lower Leg - Medial Left Malleolus - Anterior N/A Wounding Event: Blister Gradually Appeared N/A Primary Etiology: Venous Leg Ulcer Venous Leg Ulcer N/A Comorbid History: Hypertension, Myocardial Hypertension, Myocardial N/A Infarction, Osteoarthritis, Infarction, Osteoarthritis, Dementia Dementia Date Acquired: 07/02/2019 08/30/2019 N/A Weeks of Treatment: 7 0 N/A Wound Status: Open Open N/A Measurements L x W x D 3x1.7x0.1 0.4x0.5x0.1 N/A (cm) Area (cm) : 4.006 0.157 N/A Volume (cm) : 0.401 0.016 N/A % Reduction in Area: 81.60% 0.00% N/A %  Reduction in Volume: 81.60% 0.00% N/A Classification: Full Thickness Without Full Thickness Without N/A Exposed Support Structures Exposed Support Structures Exudate Amount: Large Medium N/A Exudate Type: Serous Serosanguineous N/A Exudate Color: amber red, brown N/A Wound Margin: Flat and Intact Flat and Intact N/A Granulation Amount: Large (67-100%) Large (67-100%) N/A Granulation Quality: Red  Red N/A Necrotic Amount: Small (1-33%) None Present (0%) N/A Exposed Structures: Fat Layer (Subcutaneous Fat Layer (Subcutaneous N/A Tissue) Exposed: Yes Tissue) Exposed: Yes Fascia: No Fascia: No Tendon: No Tendon: No Muscle: No Muscle: No April Powers, April Powers ([id number]) Joint: No Joint: No Bone: No Bone: No Epithelialization: None N/A N/A Treatment Notes Electronic Signature(s) Signed: 08/30/2019 5:22:37 PM By: Elliot GurneyWoody, BSN, RN, CWS, Kim RN, BSN Entered By: Elliot GurneyWoody, BSN, RN, CWS, Kim on 08/30/2019 17:22:37 April Powers, April Powers ([id number]) -------------------------------------------------------------------------------- Multi-Disciplinary Care Plan Details Patient Name: April Powers, Ramina Date of Service: 08/30/2019 4:45 PM Medical Record Number: [id number] Patient Account Number: [id number] Date of Birth/Sex: Dec 10, 1927 (85 y.o. F) Treating RN: Huel CoventryWoody, Kim Primary Care Luretta Everly: Orson EvaBOSWELL, CHELSA Other Clinician: Referring Darril Patriarca: Orson EvaBOSWELL, CHELSA Treating Jalyn Rosero/Extender: Linwood DibblesSTONE III, HOYT Weeks in Treatment: 7 Active Inactive Orientation to the Wound Care Program Nursing Diagnoses: Knowledge deficit related to the wound healing center program Goals: Patient/caregiver will verbalize understanding of the Wound Healing Center Program Date Initiated: 07/12/2019 Target Resolution Date: 08/12/2019 Goal Status: Active Interventions: Provide education on orientation to the wound center Notes: Venous Leg Ulcer Nursing Diagnoses: Potential for venous Insuffiency (use before diagnosis  confirmed) Goals: Patient will maintain optimal edema control Date Initiated: 07/12/2019 Target Resolution Date: 08/12/2019 Goal Status: Active Interventions: Assess peripheral edema status every visit. Treatment Activities: Therapeutic compression applied : 07/12/2019 Notes: Wound/Skin Impairment Nursing Diagnoses: Impaired tissue integrity Goals: Patient/caregiver will verbalize understanding of skin care regimen Date Initiated: 07/12/2019 Target Resolution Date: 07/12/2019 Goal Status: Active April Powers, April Powers ([id number]) Ulcer/skin breakdown will have a volume reduction of 30% by week 4 Date Initiated: 07/12/2019 Target Resolution Date: 08/12/2019 Goal Status: Active Interventions: Assess ulceration(s) every visit Treatment Activities: Skin care regimen initiated : 07/12/2019 Topical wound management initiated : 07/12/2019 Notes: Electronic Signature(s) Signed: 09/01/2019 5:12:33 PM By: Elliot GurneyWoody, BSN, RN, CWS, Kim RN, BSN Entered By: Elliot GurneyWoody, BSN, RN, CWS, Kim on 08/30/2019 17:10:14 April Powers, April Powers ([id number]) -------------------------------------------------------------------------------- Pain Assessment Details Patient Name: April Powers, April Powers Date of Service: 08/30/2019 4:45 PM Medical Record Number: [id number] Patient Account Number: [id number] Date of Birth/Sex: Dec 10, 1927 (85 y.o. F) Treating RN: Huel CoventryWoody, Kim Primary Care Burlin Mcnair: Orson EvaBOSWELL, CHELSA Other Clinician: Referring Piera Downs: Orson EvaBOSWELL, CHELSA Treating Shacara Cozine/Extender: Linwood DibblesSTONE III, HOYT Weeks in Treatment: 7 Active Problems Location of Pain Severity and Description of Pain Patient Has Paino Patient Unable to Respond Site Locations Pain Management and Medication Current Pain Management: Electronic Signature(s) Signed: 09/01/2019 5:12:33 PM By: Elliot GurneyWoody, BSN, RN, CWS, Kim RN, BSN Entered By: Elliot GurneyWoody, BSN, RN, CWS, Kim on 08/30/2019 16:40:03 April Powers, Emmamae  ([id number]) -------------------------------------------------------------------------------- Patient/Caregiver Education Details Patient Name: April Powers, April Powers Date of Service: 08/30/2019 4:45 PM Medical Record Number: [id number] Patient Account Number: [id number] Date of Birth/Gender: Dec 10, 1927 (85 y.o. F) Treating RN: Huel CoventryWoody, Kim Primary Care Physician: Orson EvaBOSWELL, CHELSA Other Clinician: Referring Physician: Orson EvaBOSWELL, CHELSA Treating Physician/Extender: Skeet SimmerSTONE III, HOYT Weeks in Treatment: 7 Education Assessment Education Provided To: Patient Education Topics Provided Wound/Skin Impairment: Handouts: Caring for Your Ulcer Methods: Demonstration, Explain/Verbal Responses: State content correctly Electronic Signature(s) Signed: 09/01/2019 5:12:33 PM By: Elliot GurneyWoody, BSN, RN, CWS, Kim RN, BSN Entered By: Elliot GurneyWoody, BSN, RN, CWS, Kim on 08/30/2019 17:11:10 April Powers, April Powers ([id number]) -------------------------------------------------------------------------------- Wound Assessment Details Patient Name: April Powers, April Powers Date of Service: 08/30/2019 4:45 PM Medical Record Number: [id number] Patient Account Number: [id number] Date of Birth/Sex: Dec 10, 1927 (85 y.o. F) Treating RN: Huel CoventryWoody, Kim Primary Care Zay Yeargan: Orson EvaBOSWELL, CHELSA Other Clinician: Referring Latarra Eagleton: Orson EvaBOSWELL, CHELSA Treating Zekiah Coen/Extender: STONE III, HOYT Weeks in Treatment: 7 Wound Status Wound Number: 1 Primary Venous Leg  Ulcer Etiology: Wound Location: Left Lower Leg - Medial Wound Status: Open Wounding Event: Blister Comorbid Hypertension, Myocardial Infarction, Date Acquired: 07/02/2019 History: Osteoarthritis, Dementia Weeks Of Treatment: 7 Clustered Wound: No Photos Wound Measurements Length: (cm) 3 Width: (cm) 1.7 Depth: (cm) 0.1 Area: (cm) 4.006 Volume: (cm) 0.401 % Reduction in Area: 81.6% % Reduction in Volume: 81.6% Epithelialization: None Wound Description Full Thickness Without Exposed Support Foul  Od Classification: Structures Slough/ Wound Margin: Flat and Intact Exudate Large Amount: Exudate Type: Serous Exudate Color: amber or After Cleansing: No Fibrino Yes Wound Bed Granulation Amount: Large (67-100%) Exposed Structure Granulation Quality: Red Fascia Exposed: No Necrotic Amount: Small (1-33%) Fat Layer (Subcutaneous Tissue) Exposed: Yes Necrotic Quality: Adherent Slough Tendon Exposed: No Muscle Exposed: No Joint Exposed: No Bone Exposed: No CEDRA, VILLALON ([id number]) Electronic Signature(s) Signed: 09/01/2019 5:12:33 PM By: Gretta Cool, BSN, RN, CWS, Kim RN, BSN Entered By: Gretta Cool, BSN, RN, CWS, Kim on 08/30/2019 16:42:21 [name] ([id number]) -------------------------------------------------------------------------------- Wound Assessment Details Patient Name: [name] Date of Service: 08/30/2019 4:45 PM Medical Record Number: [id number] Patient Account Number: [id number] Date of Birth/Sex: 1928-01-31 (85 y.o. F) Treating RN: Cornell Barman Primary Care Sami Roes: Evern Bio Other Clinician: Referring Laci Frenkel: Evern Bio Treating Carolin Quang/Extender: Melburn Hake, HOYT Weeks in Treatment: 7 Wound Status Wound Number: 4 Primary Venous Leg Ulcer Etiology: Wound Location: Left Malleolus - Anterior Wound Status: Open Wounding Event: Gradually Appeared Comorbid Hypertension, Myocardial Infarction, Date Acquired: 08/30/2019 History: Osteoarthritis, Dementia Weeks Of Treatment: 0 Clustered Wound: No Photos Wound Measurements Length: (cm) 0.4 % Reduc Width: (cm) 0.5 % Reduc Depth: (cm) 0.1 Tunneli Area: (cm) 0.157 Underm Volume: (cm) 0.016 tion in Area: 0% tion in Volume: 0% ng: No ining: No Wound Description Full Thickness Without Exposed Support Foul O Classification: Structures Slough Wound Margin: Flat and Intact Exudate Medium Amount: Exudate Type: Serosanguineous Exudate Color: red, brown dor After Cleansing: No /Fibrino  Yes Wound Bed Granulation Amount: Large (67-100%) Exposed Structure Granulation Quality: Red Fascia Exposed: No Necrotic Amount: None Present (0%) Fat Layer (Subcutaneous Tissue) Exposed: Yes Tendon Exposed: No Muscle Exposed: No Joint Exposed: No Bone Exposed: No TALYAH, SEDER ([id number]) Electronic Signature(s) Signed: 09/01/2019 5:12:33 PM By: Gretta Cool, BSN, RN, CWS, Kim RN, BSN Entered By: Gretta Cool, BSN, RN, CWS, Kim on 08/30/2019 17:15:38 [name] ([id number]) -------------------------------------------------------------------------------- Vitals Details Patient Name: [name] Date of Service: 08/30/2019 4:45 PM Medical Record Number: [id number] Patient Account Number: [id number] Date of Birth/Sex: 01-07-1928 (85 y.o. F) Treating RN: Cornell Barman Primary Care Dehaven Sine: Evern Bio Other Clinician: Referring Keveon Amsler: Evern Bio Treating Javious Hallisey/Extender: Melburn Hake, HOYT Weeks in Treatment: 7 Vital Signs Time Taken: 16:40 Temperature (F): 97.5 Height (in): 68 Pulse (bpm): 140 Weight (lbs): 180 Respiratory Rate (breaths/min): 18 Body Mass Index (BMI): 27.4 Blood Pressure (mmHg): 129/98 Reference Range: 80 - 120 mg / dl Electronic Signature(s) Signed: 09/01/2019 5:12:33 PM By: Gretta Cool, BSN, RN, CWS, Kim RN, BSN Entered By: Gretta Cool, BSN, RN, CWS, Kim on 08/30/2019 16:40:46

## 2019-09-05 NOTE — Progress Notes (Addendum)
April Powers ([id number]) Visit Report for 08/30/2019 Chief Complaint Document Details Patient Name: April Powers Date of Service: 08/30/2019 4:45 PM Medical Record Number: [id number] Patient Account Number: [id number] Date of Birth/Sex: [birth year] (85 y.o. F) Treating RN: Arnette Norris Primary Care Provider: Orson Eva Other Clinician: Referring Provider: Orson Eva Treating Provider/Extender: Linwood Dibbles, HOYT Weeks in Treatment: 7 Information Obtained from: Patient Chief Complaint Bilateral LE Ulcers Electronic Signature(s) Signed: 08/30/2019 4:53:28 PM By: Lenda Kelp PA-C Entered By: Lenda Kelp on 08/30/2019 16:53:28 April Powers ([id number]) -------------------------------------------------------------------------------- HPI Details Patient Name: April Powers Date of Service: 08/30/2019 4:45 PM Medical Record Number: [id number] Patient Account Number: [id number] Date of Birth/Sex: [birth year] (85 y.o. F) Treating RN: Arnette Norris Primary Care Provider: Orson Eva Other Clinician: Referring Provider: Orson Eva Treating Provider/Extender: Linwood Dibbles, HOYT Weeks in Treatment: 7 History of Present Illness HPI Description: 07/12/2019 on evaluation today patient appears for initial evaluation here in our clinic today concerning an issue that is been happening with her bilateral lower extremities and this began about 10 days ago. She does have bilateral lower extremity edema secondary to venous stasis. This does appear to be stage I lymphedema most likely. With that being said there is no signs of active infection at this time which is good news. That was one concern of her sister was that the patient may have an infection and required antibiotic. The good news is that does not seem to be the case. With that being said I do think the patient needs good edema control at this point. She has previously had compression stockings but is been quite  some time since she has worn them according to her sister who appears to be her primary caregiver and who is with her today. The patient has had a stroke and really does not talk much though fortunately other than the stroke does not appear to have a significant past medical history other than hypertension and dementia. No sharp debridement is good to be required today which is excellent news. With that being said I do believe that compression is good to be of utmost importance as far as getting these wounds to heal as well as keeping new areas from occurring. 07/20/2019 on evaluation today patient presents for follow-up concerning ongoing issues with her bilateral lower extremities. Fortunately there is no signs of active infection at this time. With that being said she still has several open wounds that are draining quite significantly at this point. There is no signs of systemic infection. No fevers, chills, nausea, vomiting, or diarrhea. The patient does have significant dementia and has very hard time getting around here in the clinic today I did advise her niece who is with her at this point that in the future she is going to need to use a wheelchair when here at the clinic in order to prevent any additional issues such as injury while she is here on the premises. I understand I do want her to try to walk but I think she needs to do so with a walker and in her more comfortable home environment where again she is more familiar with the surroundings. Patient's family member voiced understanding. 07/30/2019 upon evaluation today patient appears to be doing very well with regard to her lower extremity ulcers. She has been tolerating the dressing changes without complication. I do feel like overall she is making significant improvement which is great news. There does not appear to be any signs of active infection also great  news. Overall I am very pleased with the fact that her wounds are measuring  much smaller and there is a lot of new epithelization noted at this point. 11/4; this is a very frail woman that have not seen previously. She probably has venous ulcerations with secondary lymphedema. She does not have an open area on the right leg she does have a small area on the left anterior tibia and a superficial larger area on the left lateral calf. Her wounds appear to have been making good progress. 08/30/2019 on evaluation today patient actually appears to be showing signs of excellent progress with regard to her lower extremity ulcer. She does have a small wound just distal to the original wound on the left lower extremity which actually appears to me to be likely a wrap injury where her wrap slid down a little bit and bunched up and this probably rubbed wrong on this area. Nonetheless it seems to be healing already and in fact is doing quite well. I think it will close up fairly quickly. Electronic Signature(s) Signed: 08/30/2019 5:17:31 PM By: Lenda Kelp PA-C Entered By: Lenda Kelp on 08/30/2019 17:17:31 April Powers ([id number]) -------------------------------------------------------------------------------- Physical Exam Details Patient Name: April Powers Date of Service: 08/30/2019 4:45 PM Medical Record Number: [id number] Patient Account Number: [id number] Date of Birth/Sex: 1928/01/06 (85 y.o. F) Treating RN: Arnette Norris Primary Care Provider: Orson Eva Other Clinician: Referring Provider: Orson Eva Treating Provider/Extender: STONE III, HOYT Weeks in Treatment: 7 Constitutional Well-nourished and well-hydrated in no acute distress. Respiratory normal breathing without difficulty. clear to auscultation bilaterally. Cardiovascular regular rate and rhythm with normal S1, S2. Psychiatric this patient is able to make decisions and demonstrates good insight into disease process. Alert and Oriented x 3. pleasant and cooperative. Notes His  wounds currently showed signs of excellent epithelization and overall seem to be progressing quite nicely. There does not appear to be any signs of active infection which is good news. No fevers, chills, nausea, vomiting, or diarrhea. Electronic Signature(s) Signed: 08/30/2019 5:18:12 PM By: Lenda Kelp PA-C Entered By: Lenda Kelp on 08/30/2019 17:18:11 April Powers ([id number]) -------------------------------------------------------------------------------- Physician Orders Details Patient Name: April Powers Date of Service: 08/30/2019 4:45 PM Medical Record Number: [id number] Patient Account Number: [id number] Date of Birth/Sex: 10-Jan-1928 (85 y.o. 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 - Please do not get your wraps wet o No tub bath. Wound #4 Left,Anterior Malleolus o May shower with protection. - Please do not get your wraps wet o No tub bath. Anesthetic (add to Medication List) Wound #1 Left,Medial Lower Leg o Topical Lidocaine 4% cream applied to wound bed prior to debridement (In Clinic Only). Wound #4 Left,Anterior Malleolus o Topical Lidocaine 4% cream applied to wound bed prior to debridement (In Clinic Only). Primary Wound Dressing Wound #1 Left,Medial Lower Leg o Silver Collagen - do  not wet Wound #4 Left,Anterior Malleolus o Silver Collagen - do not wet Secondary Dressing Wound #1  Left,Medial Lower Leg o XtraSorb Wound #4 Left,Anterior Malleolus o XtraSorb Dressing Change Frequency Wound #1 Left,Medial Lower Leg April Powers, April Powers ([id number]) o Change Dressing Monday, Wednesday, Friday - Home health to change dressing Monday, Wednesday and Friday unless patient has a wound care visit on that particular day. Wound #4 Left,Anterior Malleolus o Change Dressing Monday, Wednesday, Friday - Home health to change dressing Monday, Wednesday and Friday unless patient has a wound care visit on that particular day. Follow-up Appointments Wound #1 Left,Medial Lower Leg o Return Appointment in 2 weeks. Wound #4 Left,Anterior Malleolus o Return Appointment in 2 weeks. Edema Control Wound #1 Left,Medial Lower Leg o 3 Layer Compression System - Left Lower Extremity - Unna to anchor o Elevate legs to the level of the heart and pump ankles as often as possible Wound #4 Left,Anterior Malleolus o 3 Layer Compression System - Left Lower Extremity - Unna to anchor o Elevate legs to the level of the heart and pump ankles as often as possible Home Health Wound #1 Left,Medial Lower Leg o Continue Home Health Visits o Home Health Nurse may visit PRN to address patientos wound care needs. o FACE TO FACE ENCOUNTER: MEDICARE and MEDICAID PATIENTS: I certify that this patient is under my care and that I had a face-to-face encounter that meets the physician face-to-face encounter requirements with this patient on this date. The encounter with the patient was in whole or in part for the following MEDICAL CONDITION: (primary reason for Home Healthcare) MEDICAL NECESSITY: I certify, that based on my findings, NURSING services are a medically necessary home health service. HOME BOUND STATUS: I certify that my clinical findings support that this patient is homebound  (i.e., Due to illness or injury, pt requires aid of supportive devices such as crutches, cane, wheelchairs, walkers, the use of special transportation or the assistance of another person to leave their place of residence. There is a normal inability to leave the home and doing so requires considerable and taxing effort. Other absences are for medical reasons / religious services and are infrequent or of short duration when for other reasons). o If current dressing causes regression in wound condition, may D/C ordered dressing product/s and apply Normal Saline Moist Dressing daily until next Wound Healing Center / Other MD appointment. Notify Wound Healing Center of regression in wound condition at [phone]. o Please direct any NON-WOUND related issues/requests for orders to patient's Primary Care Physician Wound #4 Left,Anterior Malleolus o Continue Home Health Visits o Home Health Nurse may visit PRN to address patientos wound care needs. o FACE TO FACE ENCOUNTER: MEDICARE and MEDICAID PATIENTS: I certify that this patient is under my care and that I had a face-to-face encounter that meets the physician face-to-face encounter requirements with this patient on this date. The encounter with the patient was in whole or in part for the following MEDICAL CONDITION: (primary reason for Home Healthcare) MEDICAL NECESSITY: I certify, that based on my findings, NURSING services are a medically necessary home health service. HOME BOUND STATUS: I certify that my clinical findings support that this patient is homebound (i.e., Due to illness or injury, pt requires aid of supportive devices such as crutches, cane, wheelchairs, walkers, the use of special transportation or the assistance of another person to leave their place of residence. There is a normal inability to leave the home and doing so requires considerable and taxing effort. Other absences are for medical reasons /  religious services and are infrequent or of short duration  when for other reasons). April Powers, April Powers ([id number]) o If current dressing causes regression in wound condition, may D/C ordered dressing product/s and apply Normal Saline Moist Dressing daily until next Wound Healing Center / Other MD appointment. Notify Wound Healing Center of regression in wound condition at [phone]. o Please direct any NON-WOUND related issues/requests for orders to patient's Primary Care Physician Electronic Signature(s) Signed: 08/30/2019 10:15:26 PM By: Lenda Kelp PA-C Signed: 09/01/2019 5:12:33 PM By: Elliot Gurney, BSN, RN, CWS, Kim RN, BSN Entered By: Elliot Gurney, BSN, RN, CWS, Kim on 08/30/2019 17:31:29 April Powers ([id number]) -------------------------------------------------------------------------------- Problem List Details Patient Name: April Powers Date of Service: 08/30/2019 4:45 PM Medical Record Number: [id number] Patient Account Number: [id number] Date of Birth/Sex: 12-04-1927 (85 y.o. F) Treating RN: Arnette Norris Primary Care Provider: Orson Eva Other Clinician: Referring Provider: Orson Eva Treating Provider/Extender: Linwood Dibbles, HOYT Weeks in Treatment: 7 Active Problems ICD-10 Evaluated Encounter Code Description Active Date Today Diagnosis I89.0 Lymphedema, not elsewhere classified 07/12/2019 No Yes I87.2 Venous insufficiency (chronic) (peripheral) 07/12/2019 No Yes L97.822 Non-pressure chronic ulcer of other part of left lower leg with 07/12/2019 No Yes fat layer exposed L97.812 Non-pressure chronic ulcer of other part of right lower leg 07/12/2019 No Yes with fat layer exposed F01.50 Vascular dementia without behavioral disturbance 07/12/2019 No Yes I10 Essential (primary) hypertension 07/12/2019 No Yes Inactive Problems Resolved Problems Electronic Signature(s) Signed: 08/30/2019 4:53:22 PM By: Lenda Kelp PA-C Entered By: Lenda Kelp on 08/30/2019  16:53:21 April Powers ([id number]) -------------------------------------------------------------------------------- Progress Note Details Patient Name: April Powers Date of Service: 08/30/2019 4:45 PM Medical Record Number: [id number] Patient Account Number: [id number] Date of Birth/Sex: 11-03-1927 (85 y.o. F) Treating RN: Arnette Norris Primary Care Provider: Orson Eva Other Clinician: Referring Provider: Orson Eva Treating Provider/Extender: Linwood Dibbles, HOYT Weeks in Treatment: 7 Subjective Chief Complaint Information obtained from Patient Bilateral LE Ulcers History of Present Illness (HPI) 07/12/2019 on evaluation today patient appears for initial evaluation here in our clinic today concerning an issue that is been happening with her bilateral lower extremities and this began about 10 days ago. She does have bilateral lower extremity edema secondary to venous stasis. This does appear to be stage I lymphedema most likely. With that being said there is no signs of active infection at this time which is good news. That was one concern of her sister was that the patient may have an infection and required antibiotic. The good news is that does not seem to be the case. With that being said I do think the patient needs good edema control at this point. She has previously had compression stockings but is been quite some time since she has worn them according to her sister who appears to be her primary caregiver and who is with her today. The patient has had a stroke and really does not talk much though fortunately other than the stroke does not appear to have a significant past medical history other than hypertension and dementia. No sharp debridement is good to be required today which is excellent news. With that being said I do believe that compression is good to be of utmost importance as far as getting these wounds to heal as well as keeping new areas from  occurring. 07/20/2019 on evaluation today patient presents for follow-up concerning ongoing issues with her bilateral lower extremities. Fortunately there is no signs of active infection at this time. With that being said she still has several open wounds that are draining quite significantly at this  point. There is no signs of systemic infection. No fevers, chills, nausea, vomiting, or diarrhea. The patient does have significant dementia and has very hard time getting around here in the clinic today I did advise her niece who is with her at this point that in the future she is going to need to use a wheelchair when here at the clinic in order to prevent any additional issues such as injury while she is here on the premises. I understand I do want her to try to walk but I think she needs to do so with a walker and in her more comfortable home environment where again she is more familiar with the surroundings. Patient's family member voiced understanding. 07/30/2019 upon evaluation today patient appears to be doing very well with regard to her lower extremity ulcers. She has been tolerating the dressing changes without complication. I do feel like overall she is making significant improvement which is great news. There does not appear to be any signs of active infection also great news. Overall I am very pleased with the fact that her wounds are measuring much smaller and there is a lot of new epithelization noted at this point. 11/4; this is a very frail woman that have not seen previously. She probably has venous ulcerations with secondary lymphedema. She does not have an open area on the right leg she does have a small area on the left anterior tibia and a superficial larger area on the left lateral calf. Her wounds appear to have been making good progress. 08/30/2019 on evaluation today patient actually appears to be showing signs of excellent progress with regard to her lower extremity ulcer.  She does have a small wound just distal to the original wound on the left lower extremity which actually appears to me to be likely a wrap injury where her wrap slid down a little bit and bunched up and this probably rubbed wrong on this area. Nonetheless it seems to be healing already and in fact is doing quite well. I think it will close up fairly quickly. Patient History Information obtained from Patient. Family History Cancer - Siblings, Diabetes - Siblings, Heart Disease - Siblings,Father, Hypertension - Father,Siblings, No family history of Hereditary Spherocytosis, Kidney Disease, Lung Disease, Seizures, Stroke, Thyroid Problems, Monna FamWINSTEAD, Prerana ([id number]) Tuberculosis. Social History Never smoker, Marital Status - Widowed, Alcohol Use - Never, Drug Use - No History, Caffeine Use - Rarely. Medical History Cardiovascular Patient has history of Hypertension, Myocardial Infarction - 5 years ago Denies history of Angina, Arrhythmia, Congestive Heart Failure, Coronary Artery Disease, Deep Vein Thrombosis, Hypotension, Peripheral Arterial Disease, Peripheral Venous Disease, Phlebitis, Vasculitis Genitourinary Denies history of End Stage Renal Disease Integumentary (Skin) Denies history of History of Burn, History of pressure wounds Musculoskeletal Patient has history of Osteoarthritis Denies history of Gout, Rheumatoid Arthritis, Osteomyelitis Neurologic Patient has history of Dementia Denies history of Neuropathy, Quadriplegia, Paraplegia, Seizure Disorder Medical And Surgical History Notes Neurologic CVA with anoxic brain injury Review of Systems (ROS) Constitutional Symptoms (General Health) Denies complaints or symptoms of Fatigue, Fever, Chills, Marked Weight Change. Respiratory Denies complaints or symptoms of Chronic or frequent coughs, Shortness of Breath. Cardiovascular Denies complaints or symptoms of Chest pain, LE edema. Psychiatric Denies complaints or symptoms of  Anxiety, Claustrophobia. Objective Constitutional Well-nourished and well-hydrated in no acute distress. Vitals Time Taken: 4:40 PM, Height: 68 in, Weight: 180 lbs, BMI: 27.4, Temperature: 97.5 F, Pulse: 140 bpm, Respiratory Rate: 18 breaths/min, Blood Pressure: 129/98 mmHg. Respiratory normal  breathing without difficulty. clear to auscultation bilaterally. Cardiovascular regular rate and rhythm with normal S1, S2. Psychiatric this patient is able to make decisions and demonstrates good insight into disease process. Alert and Oriented x 3. pleasant April Powers, April Powers ([id number]) and cooperative. General Notes: His wounds currently showed signs of excellent epithelization and overall seem to be progressing quite nicely. There does not appear to be any signs of active infection which is good news. No fevers, chills, nausea, vomiting, or diarrhea. Integumentary (Hair, Skin) Wound #1 status is Open. Original cause of wound was Blister. The wound is located on the Left,Medial Lower Leg. The wound measures 3cm length x 1.7cm width x 0.1cm depth; 4.006cm^2 area and 0.401cm^3 volume. There is Fat Layer (Subcutaneous Tissue) Exposed exposed. There is a large amount of serous drainage noted. The wound margin is flat and intact. There is large (67-100%) red granulation within the wound bed. There is a small (1-33%) amount of necrotic tissue within the wound bed including Adherent Slough. Wound #4 status is Open. Original cause of wound was Gradually Appeared. The wound is located on the [place]. The wound measures 0.4cm length x 0.5cm width x 0.1cm depth; 0.157cm^2 area and 0.016cm^3 volume. There is Fat Layer (Subcutaneous Tissue) Exposed exposed. There is no tunneling or undermining noted. There is a medium amount of serosanguineous drainage noted. The wound margin is flat and intact. There is large (67-100%) red granulation within the wound bed. There is no necrotic tissue within the  wound bed. Assessment Active Problems ICD-10 Lymphedema, not elsewhere classified Venous insufficiency (chronic) (peripheral) Non-pressure chronic ulcer of other part of left lower leg with fat layer exposed Non-pressure chronic ulcer of other part of right lower leg with fat layer exposed Vascular dementia without behavioral disturbance Essential (primary) hypertension Plan Wound Cleansing: Wound #1 Left,Medial Lower Leg: May shower with protection. - Please do not get your wraps wet No tub bath. Wound #4 Left,Anterior Malleolus: May shower with protection. - Please do not get your wraps wet No tub bath. Anesthetic (add to Medication List): Wound #1 Left,Medial Lower Leg: Topical Lidocaine 4% cream applied to wound bed prior to debridement (In Clinic Only). Wound #4 Left,Anterior Malleolus: Topical Lidocaine 4% cream applied to wound bed prior to debridement (In Clinic Only). Primary Wound Dressing: Wound #1 Left,Medial Lower Leg: Silver Collagen - do not wet Wound #4 Left,Anterior Malleolus: Silver Collagen - do not wet April Powers, April Powers ([id number]) Secondary Dressing: Wound #1 Left,Medial Lower Leg: XtraSorb Wound #4 Left,Anterior Malleolus: XtraSorb Dressing Change Frequency: Wound #1 Left,Medial Lower Leg: Change Dressing Monday, Wednesday, Friday - Home health to change dressing Monday, Wednesday and Friday unless patient has a wound care visit on that particular day. Wound #4 Left,Anterior Malleolus: Change Dressing Monday, Wednesday, Friday - Home health to change dressing Monday, Wednesday and Friday unless patient has a wound care visit on that particular day. Follow-up Appointments: Wound #1 Left,Medial Lower Leg: Return Appointment in 2 weeks. Wound #4 Left,Anterior Malleolus: Return Appointment in 2 weeks. Edema Control: Wound #1 Left,Medial Lower Leg: 3 Layer Compression System - Left Lower Extremity - Unna to anchor Elevate legs to the level of the heart  and pump ankles as often as possible Wound #4 Left,Anterior Malleolus: 3 Layer Compression System - Left Lower Extremity - Unna to anchor Elevate legs to the level of the heart and pump ankles as often as possible Home Health: Wound #1 Left,Medial Lower Leg: [facility] Nurse may visit PRN to address  patient s wound care needs. FACE TO FACE ENCOUNTER: MEDICARE and MEDICAID PATIENTS: I certify that this patient is under my care and that I had a face-to-face encounter that meets the physician face-to-face encounter requirements with this patient on this date. The encounter with the patient was in whole or in part for the following MEDICAL CONDITION: (primary reason for Home Healthcare) MEDICAL NECESSITY: I certify, that based on my findings, NURSING services are a medically necessary home health service. HOME BOUND STATUS: I certify that my clinical findings support that this patient is homebound (i.e., Due to illness or injury, pt requires aid of supportive devices such as crutches, cane, wheelchairs, walkers, the use of special transportation or the assistance of another person to leave their place of residence. There is a normal inability to leave the home and doing so requires considerable and taxing effort. Other absences are for medical reasons / religious services and are infrequent or of short duration when for other reasons). If current dressing causes regression in wound condition, may D/C ordered dressing product/s and apply Normal Saline Moist Dressing daily until next Wound Healing Center / Other MD appointment. Notify Wound Healing Center of regression in wound condition at [phone]. Please direct any NON-WOUND related issues/requests for orders to patient's Primary Care Physician Wound #4 Left,Anterior Malleolus: Continue Home Health Visits Home Health Nurse may visit PRN to address patient s wound care needs. FACE TO FACE ENCOUNTER: MEDICARE and  MEDICAID PATIENTS: I certify that this patient is under my care and that I had a face-to-face encounter that meets the physician face-to-face encounter requirements with this patient on this date. The encounter with the patient was in whole or in part for the following MEDICAL CONDITION: (primary reason for Home Healthcare) MEDICAL NECESSITY: I certify, that based on my findings, NURSING services are a medically necessary home health service. HOME BOUND STATUS: I certify that my clinical findings support that this patient is homebound (i.e., Due to illness or injury, pt requires aid of supportive devices such as crutches, cane, wheelchairs, walkers, the use of special transportation or the assistance of another person to leave their place of residence. There is a normal inability to leave the home and doing so requires considerable and taxing effort. Other absences are for medical reasons / religious services and are infrequent or of short duration when for other reasons). If current dressing causes regression in wound condition, may D/C ordered dressing product/s and apply Normal Saline Moist Dressing daily until next Wound Healing Center / Other MD appointment. Notify Wound Healing Center of regression in wound condition at [phone]. Please direct any NON-WOUND related issues/requests for orders to patient's Primary Care Physician April Powers, April Powers ([id number]) 1. My suggestion at this time is good to be that we go ahead and initiate a continuation of the current wound care measures as the patient seems to be doing very well with these. This includes the silver collagen dressing along with Xtrasorb over top to help with fluid management. Subsequently this will also have a 3 layer compression wrap over top of the left lower extremity. 2. With regard to the right lower extremity at this point I think we can transition to her compression socks which she now has in the 20-30 mmHg range which I  think is absolutely appropriate. 3. I recommend the patient continue to elevate her legs as much as possible to help with allowing this to continue to improve. We will see patient back for reevaluation in 1 week here  in the clinic. If anything worsens or changes patient will contact our office for additional recommendations. Electronic Signature(s) Signed: 09/16/2019 12:59:18 PM By: Lenda Kelp PA-C Previous Signature: 08/30/2019 5:19:32 PM Version By: Lenda Kelp PA-C Entered By: Lenda Kelp on 09/16/2019 12:59:17 April Powers ([id number]) -------------------------------------------------------------------------------- ROS/PFSH Details Patient Name: April Powers Date of Service: 08/30/2019 4:45 PM Medical Record Number: [id number] Patient Account Number: [id number] Date of Birth/Sex: [birth year] (85 y.o. F) Treating RN: Arnette Norris Primary Care Provider: Orson Eva Other Clinician: Referring Provider: Orson Eva Treating Provider/Extender: STONE III, HOYT Weeks in Treatment: 7 Information Obtained From Patient Constitutional Symptoms (General Health) Complaints and Symptoms: Negative for: Fatigue; Fever; Chills; Marked Weight Change Respiratory Complaints and Symptoms: Negative for: Chronic or frequent coughs; Shortness of Breath Cardiovascular Complaints and Symptoms: Negative for: Chest pain; LE edema Medical History: Positive for: Hypertension; Myocardial Infarction - 5 years ago Negative for: Angina; Arrhythmia; Congestive Heart Failure; Coronary Artery Disease; Deep Vein Thrombosis; Hypotension; Peripheral Arterial Disease; Peripheral Venous Disease; Phlebitis; Vasculitis Psychiatric Complaints and Symptoms: Negative for: Anxiety; Claustrophobia Genitourinary Medical History: Negative for: End Stage Renal Disease Integumentary (Skin) Medical History: Negative for: History of Burn; History of pressure wounds Musculoskeletal Medical  History: Positive for: Osteoarthritis Negative for: Gout; Rheumatoid Arthritis; Osteomyelitis Neurologic Medical History: Positive for: Dementia Negative for: Neuropathy; Quadriplegia; Paraplegia; Seizure Disorder Past Medical History Notes: JAYLIN, ROUNDY ([id number]) CVA with anoxic brain injury Immunizations Pneumococcal Vaccine: Received Pneumococcal Vaccination: No Implantable Devices None Family and Social History Cancer: Yes - Siblings; Diabetes: Yes - Siblings; Heart Disease: Yes - Siblings,Father; Hereditary Spherocytosis: No; Hypertension: Yes - Father,Siblings; Kidney Disease: No; Lung Disease: No; Seizures: No; Stroke: No; Thyroid Problems: No; Tuberculosis: No; Never smoker; Marital Status - Widowed; Alcohol Use: Never; Drug Use: No History; Caffeine Use: Rarely; Financial Concerns: No; Food, Clothing or Shelter Needs: No; Support System Lacking: No; Transportation Concerns: No Physician Affirmation I have reviewed and agree with the above information. Electronic Signature(s) Signed: 08/30/2019 5:37:56 PM By: Arnette Norris Signed: 08/30/2019 10:15:26 PM By: Lenda Kelp PA-C Entered By: Lenda Kelp on 08/30/2019 17:17:45 April Powers ([id number]) -------------------------------------------------------------------------------- SuperBill Details Patient Name: April Powers Date of Service: 08/30/2019 Medical Record Number: [id number] Patient Account Number: [id number] Date of Birth/Sex: 01-02-1928 (85 y.o. F) Treating RN: Arnette Norris Primary Care Provider: Orson Eva Other Clinician: Referring Provider: Orson Eva Treating Provider/Extender: Linwood Dibbles, HOYT Weeks in Treatment: 7 Diagnosis Coding ICD-10 Codes Code Description I89.0 Lymphedema, not elsewhere classified I87.2 Venous insufficiency (chronic) (peripheral) L97.822 Non-pressure chronic ulcer of other part of left lower leg with fat layer exposed L97.812 Non-pressure chronic  ulcer of other part of right lower leg with fat layer exposed F01.50 Vascular dementia without behavioral disturbance I10 Essential (primary) hypertension Facility Procedures CPT4 Code: 24401027 Description: 99213 - WOUND CARE VISIT-LEV 3 EST PT Modifier: Quantity: 1 Physician Procedures CPT4 Code Description: 2536644 03474 - WC PHYS LEVEL 4 - EST PT ICD-10 Diagnosis Description I89.0 Lymphedema, not elsewhere classified I87.2 Venous insufficiency (chronic) (peripheral) L97.822 Non-pressure chronic ulcer of other part of left lower leg  wit L97.812 Non-pressure chronic ulcer of other part of right lower leg wi Modifier: h fat layer expos th fat layer expo Quantity: 1 ed sed Electronic Signature(s) Signed: 08/31/2019 4:26:29 PM By: Cindee Salt Signed: 08/31/2019 6:12:02 PM By: Lenda Kelp PA-C Previous Signature: 08/30/2019 5:20:46 PM Version By: Lenda Kelp PA-C Entered By: Cindee Salt on 08/31/2019 16:26:28

## 2019-09-09 ENCOUNTER — Encounter: Payer: Medicare HMO | Attending: Physician Assistant | Admitting: Physician Assistant

## 2019-09-09 ENCOUNTER — Other Ambulatory Visit: Payer: Self-pay

## 2019-09-09 DIAGNOSIS — Z8673 Personal history of transient ischemic attack (TIA), and cerebral infarction without residual deficits: Secondary | ICD-10-CM | POA: Insufficient documentation

## 2019-09-09 DIAGNOSIS — M1990 Unspecified osteoarthritis, unspecified site: Secondary | ICD-10-CM | POA: Diagnosis not present

## 2019-09-09 DIAGNOSIS — Z8249 Family history of ischemic heart disease and other diseases of the circulatory system: Secondary | ICD-10-CM | POA: Diagnosis not present

## 2019-09-09 DIAGNOSIS — L97822 Non-pressure chronic ulcer of other part of left lower leg with fat layer exposed: Secondary | ICD-10-CM | POA: Diagnosis not present

## 2019-09-09 DIAGNOSIS — L97812 Non-pressure chronic ulcer of other part of right lower leg with fat layer exposed: Secondary | ICD-10-CM | POA: Insufficient documentation

## 2019-09-09 DIAGNOSIS — I252 Old myocardial infarction: Secondary | ICD-10-CM | POA: Insufficient documentation

## 2019-09-09 DIAGNOSIS — I872 Venous insufficiency (chronic) (peripheral): Secondary | ICD-10-CM | POA: Diagnosis not present

## 2019-09-09 DIAGNOSIS — I890 Lymphedema, not elsewhere classified: Secondary | ICD-10-CM | POA: Insufficient documentation

## 2019-09-09 DIAGNOSIS — F0150 Vascular dementia without behavioral disturbance: Secondary | ICD-10-CM | POA: Diagnosis not present

## 2019-09-09 DIAGNOSIS — I10 Essential (primary) hypertension: Secondary | ICD-10-CM | POA: Insufficient documentation

## 2019-09-09 DIAGNOSIS — I878 Other specified disorders of veins: Secondary | ICD-10-CM | POA: Insufficient documentation

## 2019-09-09 NOTE — Progress Notes (Addendum)
April Powers, April Powers ([id number]) Visit Report for 09/09/2019 Arrival Information Details Patient Name: April Powers, April Powers Date of Service: 09/09/2019 12:30 PM Medical Record Number: [id number] Patient Account Number: [id number] Date of Birth/Sex: 02-12-1928 (85 y.o. F) Treating RN: April Powers Primary Care April Powers: April Powers Other Clinician: Referring April Powers: April Powers Treating April Powers/Extender: April Powers, April Powers in Treatment: 8 Visit Information History Since Last Visit Added or deleted any medications: No Patient Arrived: Wheel Chair Any new allergies or adverse reactions: No Arrival Time: 12:53 Had a fall or experienced change in No Accompanied By: family activities of daily living that may affect Transfer Assistance: Manual risk of falls: Patient Identification Verified: Yes Signs or symptoms of abuse/neglect since last visito No Secondary Verification Process Yes Hospitalized since last visit: No Completed: Has Dressing in Place as Prescribed: Yes Patient Has Alerts: Yes Has Compression in Place as Prescribed: Yes Patient Alerts: Patient on Blood Pain Present Now: No Thinner Eliquis Electronic Signature(s) Signed: 09/09/2019 4:50:58 PM By: April Powers Entered By: April Powers on 09/09/2019 12:58:23 April Powers ([id number]) -------------------------------------------------------------------------------- Compression Therapy Details Patient Name: April Powers Date of Service: 09/09/2019 12:30 PM Medical Record Number: [id number] Patient Account Number: [id number] Date of Birth/Sex: 10-19-1927 (85 y.o. F) Treating RN: April Powers Primary Care April Powers: April Powers Other Clinician: Referring Redell Bhandari: April Powers Treating Authur Powers/Extender: STONE III, April Powers in Treatment: 8 Compression Therapy Performed for Wound Assessment: Wound #1 Left,Medial Lower Leg Performed By: Clinician April Perna, RN Compression Type: Three Layer Pre  Treatment ABI: 1 Post Procedure Diagnosis Same as Pre-procedure Electronic Signature(s) Signed: 09/09/2019 4:43:12 PM By: April Powers Entered By: April Powers on 09/09/2019 13:14:15 April Powers ([id number]) -------------------------------------------------------------------------------- Encounter Discharge Information Details Patient Name: April Powers Date of Service: 09/09/2019 12:30 PM Medical Record Number: [id number] Patient Account Number: [id number] Date of Birth/Sex: September 17, 1928 (85 y.o. F) Treating RN: April Powers Primary Care Montrez Marietta: April Powers Other Clinician: Referring Janalyn Higby: April Powers Treating Aarion Kittrell/Extender: April Powers, April Powers in Treatment: 8 Encounter Discharge Information Items Discharge Condition: Stable Ambulatory Status: Wheelchair Discharge Destination: Home Transportation: Private Auto Accompanied By: family Schedule Follow-up Appointment: Yes Clinical Summary of Care: Electronic Signature(s) Signed: 09/09/2019 4:43:12 PM By: April Powers Entered By: April Powers on 09/09/2019 13:16:28 April Powers ([id number]) -------------------------------------------------------------------------------- Lower Extremity Assessment Details Patient Name: April Powers Date of Service: 09/09/2019 12:30 PM Medical Record Number: [id number] Patient Account Number: [id number] Date of Birth/Sex: [birth year] (85 y.o. F) Treating RN: April Powers Primary Care Edgar Reisz: April Powers Other Clinician: Referring April Powers: April Powers Treating Aahil Fredin/Extender: STONE III, April Powers in Treatment: 8 Edema Assessment Assessed: [Left: No] [Right: No] Edema: [Left: N] [Right: o] Calf Left: Right: Point of Measurement: 34 cm From Medial Instep 33.2 cm cm Ankle Left: Right: Point of Measurement: 10 cm From Medial Instep 21.6 cm cm Vascular Assessment Pulses: Dorsalis Pedis Palpable: [Left:Yes] Electronic Signature(s) Signed: 09/09/2019  4:50:58 PM By: April Powers Entered By: April Powers on 09/09/2019 13:03:49 April Powers ([id number]) -------------------------------------------------------------------------------- Multi Wound Chart Details Patient Name: April Powers Date of Service: 09/09/2019 12:30 PM Medical Record Number: [id number] Patient Account Number: [id number] Date of Birth/Sex: 04/01/1928 (85 y.o. F) Treating RN: April Powers Primary Care Noelia Lenart: April Powers Other Clinician: Referring April Powers: April Powers Treating April Powers/Extender: STONE III, April Powers in Treatment: 8 Vital Signs Height(in): 68 Pulse(bpm): 110 Weight(lbs): 180 Blood Pressure(mmHg): 100/62 Body Mass Index(BMI): 27 Temperature(F): 98.2 Respiratory Rate 16 (breaths/min): Photos: [N/A:N/A] Wound Location: Left, Medial Lower Leg Left Malleolus - Anterior N/A Wounding Event: Blister Gradually Appeared N/A Primary  Etiology: Venous Leg Ulcer Venous Leg Ulcer N/A Comorbid History: Hypertension, Myocardial Hypertension, Myocardial N/A Infarction, Osteoarthritis, Infarction, Osteoarthritis, Dementia Dementia Date Acquired: 07/02/2019 08/30/2019 N/A Powers of Treatment: 8 1 N/A Wound Status: Open Open N/A Measurements L x W x D 0.1x0.1x0.1 0x0x0 N/A (cm) Area (cm) : 0.008 0 N/A Volume (cm) : 0.001 0 N/A % Reduction in Area: 100.00% 100.00% N/A % Reduction in Volume: 100.00% 100.00% N/A Classification: Full Thickness Without Full Thickness Without N/A Exposed Support Structures Exposed Support Structures Exudate Amount: None Present None Present N/A Wound Margin: Flat and Intact Flat and Intact N/A Granulation Amount: None Present (0%) None Present (0%) N/A Necrotic Amount: None Present (0%) None Present (0%) N/A Exposed Structures: Fascia: No Fascia: No N/A Fat Layer (Subcutaneous Fat Layer (Subcutaneous Tissue) Exposed: No Tissue) Exposed: No Tendon: No Tendon: No Muscle: No Muscle: No Joint:  No Joint: No Bone: No Bone: No Limited to Skin Breakdown Limited to Skin April Powers, April Powers ([id number]) Epithelialization: Large (67-100%) Large (67-100%) N/A Treatment Notes Electronic Signature(s) Signed: 09/09/2019 4:43:12 PM By: Army Melia Entered By: Army Melia on 09/09/2019 13:12:34 April Powers ([id number]) -------------------------------------------------------------------------------- Multi-Disciplinary Care Plan Details Patient Name: April Powers Date of Service: 09/09/2019 12:30 PM Medical Record Number: [id number] Patient Account Number: [id number] Date of Birth/Sex: 1927/11/10 (85 y.o. F) Treating RN: Army Melia Primary Care Nan Maya: Evern Bio Other Clinician: Referring Juanelle Trueheart: Evern Bio Treating Courtnay Petrilla/Extender: Melburn Hake, April Powers in Treatment: 8 Active Inactive Orientation to the Wound Care Program Nursing Diagnoses: Knowledge deficit related to the wound healing center program Goals: Patient/caregiver will verbalize understanding of the [facility] Program Date Initiated: 07/12/2019 Target Resolution Date: 08/12/2019 Goal Status: Active Interventions: Provide education on orientation to the wound center Notes: Venous Leg Ulcer Nursing Diagnoses: Potential for venous Insuffiency (use before diagnosis confirmed) Goals: Patient will maintain optimal edema control Date Initiated: 07/12/2019 Target Resolution Date: 08/12/2019 Goal Status: Active Interventions: Assess peripheral edema status every visit. Treatment Activities: Therapeutic compression applied : 07/12/2019 Notes: Wound/Skin Impairment Nursing Diagnoses: Impaired tissue integrity Goals: Patient/caregiver will verbalize understanding of skin care regimen Date Initiated: 07/12/2019 Target Resolution Date: 07/12/2019 Goal Status: Active April Powers, April Powers ([id number]) Ulcer/skin breakdown will have a volume reduction of 30% by week 4 Date Initiated:  07/12/2019 Target Resolution Date: 08/12/2019 Goal Status: Active Interventions: Assess ulceration(s) every visit Treatment Activities: Skin care regimen initiated : 07/12/2019 Topical wound management initiated : 07/12/2019 Notes: Electronic Signature(s) Signed: 09/09/2019 4:43:12 PM By: Army Melia Entered By: Army Melia on 09/09/2019 13:12:26 April Powers ([id number]) -------------------------------------------------------------------------------- Pain Assessment Details Patient Name: April Powers Date of Service: 09/09/2019 12:30 PM Medical Record Number: [id number] Patient Account Number: [id number] Date of Birth/Sex: June 06, 1928 (85 y.o. F) Treating RN: Harold Barban Primary Care Zeriyah Wain: Evern Bio Other Clinician: Referring Betti Goodenow: Evern Bio Treating Dearra Myhand/Extender: Melburn Hake, April Powers in Treatment: 8 Active Problems Location of Pain Severity and Description of Pain Patient Has Paino No Site Locations Pain Management and Medication Current Pain Management: Electronic Signature(s) Signed: 09/09/2019 4:50:58 PM By: Harold Barban Entered By: Harold Barban on 09/09/2019 12:58:29 April Powers ([id number]) -------------------------------------------------------------------------------- Patient/Caregiver Education Details Patient Name: April Powers Date of Service: 09/09/2019 12:30 PM Medical Record Number: [id number] Patient Account Number: [id number] Date of Birth/Gender: 1928-07-11 (85 y.o. F) Treating RN: Army Melia Primary Care Physician: Evern Bio Other Clinician: Referring Physician: Evern Bio Treating Physician/Extender: Sharalyn Ink in Treatment: 8 Education Assessment Education Provided To: Patient Education Topics Provided Wound/Skin Impairment: Handouts: Caring for Your Ulcer Methods:  Demonstration, Explain/Verbal Responses: State content correctly Electronic Signature(s) Signed: 09/09/2019 4:43:12 PM  By: April PernaScott, Dajea Entered By: April PernaScott, Dajea on 09/09/2019 13:15:49 April Powers, Nickey ([id number]) -------------------------------------------------------------------------------- Wound Assessment Details Patient Name: April Powers, April Powers Date of Service: 09/09/2019 12:30 PM Medical Record Number: [id number] Patient Account Number: [id number] Date of Birth/Sex: [birth year] (85 y.o. F) Treating RN: April PernaScott, Dajea Primary Care Chaunte Hornbeck: April EvaBOSWELL, CHELSA Other Clinician: Referring Lavona Norsworthy: April EvaBOSWELL, CHELSA Treating Lanique Gonzalo/Extender: STONE III, April Powers in Treatment: 8 Wound Status Wound Number: 1 Primary Venous Leg Ulcer Etiology: Wound Location: Left, Medial Lower Leg Wound Status: Open Wounding Event: Blister Comorbid Hypertension, Myocardial Infarction, Date Acquired: 07/02/2019 History: Osteoarthritis, Dementia Powers Of Treatment: 8 Clustered Wound: No Photos Wound Measurements Length: (cm) 0.1 Width: (cm) 0.1 Depth: (cm) 0.1 Area: (cm) 0.008 Volume: (cm) 0.001 % Reduction in Area: 100% % Reduction in Volume: 100% Epithelialization: Large (67-100%) Tunneling: No Undermining: No Wound Description Full Thickness Without Exposed Support Foul Odo Classification: Structures Slough/F Wound Margin: Flat and Intact Exudate None Present Amount: r After Cleansing: No ibrino No Wound Bed Granulation Amount: None Present (0%) Exposed Structure Necrotic Amount: None Present (0%) Fascia Exposed: No Fat Layer (Subcutaneous Tissue) Exposed: No Tendon Exposed: No Muscle Exposed: No Joint Exposed: No Bone Exposed: No Limited to Skin Breakdown Treatment Notes April Powers, April Powers ([id number]) Wound #1 (Left, Medial Lower Leg) Notes silver collagen, abd, 3L(L), unna to anchor Electronic Signature(s) Signed: 09/09/2019 4:43:12 PM By: April PernaScott, Dajea Entered By: April PernaScott, Dajea on 09/09/2019 13:12:19 April Powers, April Powers  ([id number]) -------------------------------------------------------------------------------- Wound Assessment Details Patient Name: April Powers, April Powers Date of Service: 09/09/2019 12:30 PM Medical Record Number: [id number] Patient Account Number: [id number] Date of Birth/Sex: [birth year] (85 y.o. F) Treating RN: April NorrisBiell, Kristina Primary Care Ryian Lynde: April EvaBOSWELL, CHELSA Other Clinician: Referring Jenni Thew: April EvaBOSWELL, CHELSA Treating Kassiah Mccrory/Extender: STONE III, April Powers in Treatment: 8 Wound Status Wound Number: 4 Primary Venous Leg Ulcer Etiology: Wound Location: Left Malleolus - Anterior Wound Status: Open Wounding Event: Gradually Appeared Comorbid Hypertension, Myocardial Infarction, Date Acquired: 08/30/2019 History: Osteoarthritis, Dementia Powers Of Treatment: 1 Clustered Wound: No Photos Wound Measurements Length: (cm) 0 % Reduct Width: (cm) 0 % Reduct Depth: (cm) 0 Epitheli Area: (cm) 0 Tunneli Volume: (cm) 0 Undermi ion in Area: 100% ion in Volume: 100% alization: Large (67-100%) ng: No ning: No Wound Description Full Thickness Without Exposed Support Foul Odo Classification: Structures Slough/F Wound Margin: Flat and Intact Exudate None Present Amount: r After Cleansing: No ibrino No Wound Bed Granulation Amount: None Present (0%) Exposed Structure Necrotic Amount: None Present (0%) Fascia Exposed: No Fat Layer (Subcutaneous Tissue) Exposed: No Tendon Exposed: No Muscle Exposed: No Joint Exposed: No Bone Exposed: No Limited to Skin Breakdown Electronic Signature(s) April Powers, Geovanna ([id number]) Signed: 09/09/2019 4:50:58 PM By: April NorrisBiell, Kristina Entered By: April NorrisBiell, Kristina on 09/09/2019 13:09:10 April Powers, Keziah ([id number]) -------------------------------------------------------------------------------- Vitals Details Patient Name: April Powers, Leonna Date of Service: 09/09/2019 12:30 PM Medical Record Number: [id number] Patient Account Number: [id number] Date of  Birth/Sex: [birth year] (85 y.o. F) Treating RN: April NorrisBiell, Kristina Primary Care Emila Steinhauser: April EvaBOSWELL, CHELSA Other Clinician: Referring Sherelle Castelli: April EvaBOSWELL, CHELSA Treating Marquite Attwood/Extender: STONE III, April Powers in Treatment: 8 Vital Signs Time Taken: 12:55 Temperature (F): 98.2 Height (in): 68 Pulse (bpm): 110 Weight (lbs): 180 Respiratory Rate (breaths/min): 16 Body Mass Index (BMI): 27.4 Blood Pressure (mmHg): 100/62 Reference Range: 80 - 120 mg / dl Electronic Signature(s) Signed: 09/09/2019 4:50:58 PM By: April NorrisBiell, Kristina Entered By: April NorrisBiell, Kristina on 09/09/2019 13:01:08

## 2019-09-09 NOTE — Progress Notes (Addendum)
April QuintWINSTEAD, Adja ([id number]) Visit Report for 09/09/2019 Chief Complaint Document Details Patient Name: April QuintWINSTEAD, Rossi Date of Service: 09/09/2019 12:30 PM Medical Record Number: [id number] Patient Account Number: [id number] Date of Birth/Sex: 03-Jun-1928 (85 y.o. F) Treating RN: Rodell PernaScott, Dajea Primary Care Provider: Orson EvaBOSWELL, CHELSA Other Clinician: Referring Provider: Orson EvaBOSWELL, CHELSA Treating Provider/Extender: Linwood DibblesSTONE III, Fajr Fife Weeks in Treatment: 8 Information Obtained from: Patient Chief Complaint Bilateral LE Ulcers Electronic Signature(s) Signed: 09/09/2019 12:55:13 PM By: Lenda KelpStone III, Janes Colegrove PA-C Entered By: Lenda KelpStone III, Maebell Lyvers on 09/09/2019 12:55:13 April QuintWINSTEAD, April Powers ([id number]) -------------------------------------------------------------------------------- HPI Details Patient Name: April QuintWINSTEAD, April Powers Date of Service: 09/09/2019 12:30 PM Medical Record Number: [id number] Patient Account Number: [id number] Date of Birth/Sex: 03-Jun-1928 (85 y.o. F) Treating RN: Rodell PernaScott, Dajea Primary Care Provider: Orson EvaBOSWELL, CHELSA Other Clinician: Referring Provider: Orson EvaBOSWELL, CHELSA Treating Provider/Extender: Linwood DibblesSTONE III, Havilah Topor Weeks in Treatment: 8 History of Present Illness HPI Description: 07/12/2019 on evaluation today patient appears for initial evaluation here in our clinic today concerning an issue that is been happening with her bilateral lower extremities and this began about 10 days ago. She does have bilateral lower extremity edema secondary to venous stasis. This does appear to be stage I lymphedema most likely. With that being said there is no signs of active infection at this time which is good news. That was one concern of her sister was that the patient may have an infection and required antibiotic. The good news is that does not seem to be the case. With that being said I do think the patient needs good edema control at this point. She has previously had compression stockings but is been quite some time  since she has worn them according to her sister who appears to be her primary caregiver and who is with her today. The patient has had a stroke and really does not talk much though fortunately other than the stroke does not appear to have a significant past medical history other than hypertension and dementia. No sharp debridement is good to be required today which is excellent news. With that being said I do believe that compression is good to be of utmost importance as far as getting these wounds to heal as well as keeping new areas from occurring. 07/20/2019 on evaluation today patient presents for follow-up concerning ongoing issues with her bilateral lower extremities. Fortunately there is no signs of active infection at this time. With that being said she still has several open wounds that are draining quite significantly at this point. There is no signs of systemic infection. No fevers, chills, nausea, vomiting, or diarrhea. The patient does have significant dementia and has very hard time getting around here in the clinic today I did advise her niece who is with her at this point that in the future she is going to need to use a wheelchair when here at the clinic in order to prevent any additional issues such as injury while she is here on the premises. I understand I do want her to try to walk but I think she needs to do so with a walker and in her more comfortable home environment where again she is more familiar with the surroundings. Patient's family member voiced understanding. 07/30/2019 upon evaluation today patient appears to be doing very well with regard to her lower extremity ulcers. She has been tolerating the dressing changes without complication. I do feel like overall she is making significant improvement which is great news. There does not appear to be any signs of active infection also great  news. Overall I am very pleased with the fact that her wounds are measuring much  smaller and there is a lot of new epithelization noted at this point. 11/4; this is a very frail woman that have not seen previously. She probably has venous ulcerations with secondary lymphedema. She does not have an open area on the right leg she does have a small area on the left anterior tibia and a superficial larger area on the left lateral calf. Her wounds appear to have been making good progress. 08/30/2019 on evaluation today patient actually appears to be showing signs of excellent progress with regard to her lower extremity ulcer. She does have a small wound just distal to the original wound on the left lower extremity which actually appears to me to be likely a wrap injury where her wrap slid down a little bit and bunched up and this probably rubbed wrong on this area. Nonetheless it seems to be healing already and in fact is doing quite well. I think it will close up fairly quickly. 09/09/2019 on evaluation today patient appears to be doing well with regard to the left lower extremity ulcers. The anterior appears healed the medial is all but healed at this time. Fortunately there is no signs of active infection at this time. No fevers, chills, nausea, vomiting, or diarrhea. Electronic Signature(s) Signed: 09/09/2019 1:14:54 PM By: Lenda Kelp PA-C Entered By: Lenda Kelp on 09/09/2019 13:14:54 April Powers ([id number]) -------------------------------------------------------------------------------- Physical Exam Details Patient Name: April Powers Date of Service: 09/09/2019 12:30 PM Medical Record Number: [id number] Patient Account Number: [id number] Date of Birth/Sex: 1927/11/01 (85 y.o. F) Treating RN: Rodell Perna Primary Care Provider: Orson Eva Other Clinician: Referring Provider: Orson Eva Treating Provider/Extender: STONE III, Rilyn Upshaw Weeks in Treatment: 8 Constitutional Well-nourished and well-hydrated in no acute distress. Respiratory normal  breathing without difficulty. clear to auscultation bilaterally. Cardiovascular regular rate and rhythm with normal S1, S2. Psychiatric Patient is not able to cooperate in decision making regarding care. Patient has dementia. pleasant and cooperative. Notes Patient's wounds currently again are almost completely closed and in fact on the anterior portion are. She has excellent epithelization on the medial portion and though not completely close this is almost close that I think will be by next week. 1 more week of wrapping will also give this a chance to toughen up a little bit more. Electronic Signature(s) Signed: 09/09/2019 1:15:25 PM By: Lenda Kelp PA-C Entered By: Lenda Kelp on 09/09/2019 13:15:24 April Powers ([id number]) -------------------------------------------------------------------------------- Physician Orders Details Patient Name: April Powers Date of Service: 09/09/2019 12:30 PM Medical Record Number: [id number] Patient Account Number: [id number] Date of Birth/Sex: [birth year] (85 y.o. F) Treating RN: Rodell Perna Primary Care Provider: Orson Eva Other Clinician: Referring Provider: Orson Eva Treating Provider/Extender: Linwood Dibbles, Faithlyn Recktenwald Weeks in Treatment: 8 Verbal / Phone Orders: No Diagnosis Coding ICD-10 Coding Code Description I89.0 Lymphedema, not elsewhere classified I87.2 Venous insufficiency (chronic) (peripheral) L97.822 Non-pressure chronic ulcer of other part of left lower leg with fat layer exposed L97.812 Non-pressure chronic ulcer of other part of right lower leg with fat layer exposed F01.50 Vascular dementia without behavioral disturbance I10 Essential (primary) hypertension Wound Cleansing Wound #1 Left,Medial Lower Leg o May shower with protection. - Please do not get your wraps wet o No tub bath. Anesthetic (add to Medication List) Wound #1 Left,Medial Lower Leg o Topical Lidocaine 4% cream applied to wound bed prior  to debridement (In Clinic Only). Primary Wound Dressing Wound #1  Left,Medial Lower Leg o Silver Collagen - do not wet Secondary Dressing Wound #1 Left,Medial Lower Leg o ABD pad Dressing Change Frequency Wound #1 Left,Medial Lower Leg o Change Dressing Monday, Wednesday, Friday - Home health to change dressing Monday, Wednesday and Friday unless patient has a wound care visit on that particular day. Follow-up Appointments Wound #1 Left,Medial Lower Leg o Return Appointment in 1 week. Edema Control Wound #1 Left,Medial Lower Leg o 3 Layer Compression System - Left Lower Extremity - Unna to anchor o Elevate legs to the level of the heart and pump ankles as often as possible JANIAH, DEVINNEY ([id number]) Home Health Wound #1 Left,Medial Lower Leg o Continue Home Health Visits o Home Health Nurse may visit PRN to address patientos wound care needs. o FACE TO FACE ENCOUNTER: MEDICARE and MEDICAID PATIENTS: I certify that this patient is under my care and that I had a face-to-face encounter that meets the physician face-to-face encounter requirements with this patient on this date. The encounter with the patient was in whole or in part for the following MEDICAL CONDITION: (primary reason for Home Healthcare) MEDICAL NECESSITY: I certify, that based on my findings, NURSING services are a medically necessary home health service. HOME BOUND STATUS: I certify that my clinical findings support that this patient is homebound (i.e., Due to illness or injury, pt requires aid of supportive devices such as crutches, cane, wheelchairs, walkers, the use of special transportation or the assistance of another person to leave their place of residence. There is a normal inability to leave the home and doing so requires considerable and taxing effort. Other absences are for medical reasons / religious services and are infrequent or of short duration when for other reasons). o If current  dressing causes regression in wound condition, may D/C ordered dressing product/s and apply Normal Saline Moist Dressing daily until next Wound Healing Center / Other MD appointment. Notify Wound Healing Center of regression in wound condition at [phone]. o Please direct any NON-WOUND related issues/requests for orders to patient's Primary Care Physician Electronic Signature(s) Signed: 09/09/2019 4:43:12 PM By: Rodell Perna Signed: 09/09/2019 5:16:54 PM By: Lenda Kelp PA-C Entered By: Rodell Perna on 09/09/2019 13:15:18 April Powers ([id number]) -------------------------------------------------------------------------------- Problem List Details Patient Name: April Powers Date of Service: 09/09/2019 12:30 PM Medical Record Number: [id number] Patient Account Number: [id number] Date of Birth/Sex: 1927-12-21 (85 y.o. F) Treating RN: Rodell Perna Primary Care Provider: Orson Eva Other Clinician: Referring Provider: Orson Eva Treating Provider/Extender: Linwood Dibbles, Wake Conlee Weeks in Treatment: 8 Active Problems ICD-10 Evaluated Encounter Code Description Active Date Today Diagnosis I89.0 Lymphedema, not elsewhere classified 07/12/2019 No Yes I87.2 Venous insufficiency (chronic) (peripheral) 07/12/2019 No Yes L97.822 Non-pressure chronic ulcer of other part of left lower leg with 07/12/2019 No Yes fat layer exposed L97.812 Non-pressure chronic ulcer of other part of right lower leg 07/12/2019 No Yes with fat layer exposed F01.50 Vascular dementia without behavioral disturbance 07/12/2019 No Yes I10 Essential (primary) hypertension 07/12/2019 No Yes Inactive Problems Resolved Problems Electronic Signature(s) Signed: 09/09/2019 12:55:04 PM By: Lenda Kelp PA-C Entered By: Lenda Kelp on 09/09/2019 12:55:04 April Powers ([id number]) -------------------------------------------------------------------------------- Progress Note Details Patient Name: April Powers Date of Service: 09/09/2019 12:30 PM Medical Record Number: [id number] Patient Account Number: [id number] Date of Birth/Sex: [birth year] (85 y.o. F) Treating RN: Rodell Perna Primary Care Provider: Orson Eva Other Clinician: Referring Provider: Orson Eva Treating Provider/Extender: Linwood Dibbles, Haivyn Oravec Weeks in Treatment: 8 Subjective Chief Complaint Information obtained from Patient Bilateral  LE Ulcers History of Present Illness (HPI) 07/12/2019 on evaluation today patient appears for initial evaluation here in our clinic today concerning an issue that is been happening with her bilateral lower extremities and this began about 10 days ago. She does have bilateral lower extremity edema secondary to venous stasis. This does appear to be stage I lymphedema most likely. With that being said there is no signs of active infection at this time which is good news. That was one concern of her sister was that the patient may have an infection and required antibiotic. The good news is that does not seem to be the case. With that being said I do think the patient needs good edema control at this point. She has previously had compression stockings but is been quite some time since she has worn them according to her sister who appears to be her primary caregiver and who is with her today. The patient has had a stroke and really does not talk much though fortunately other than the stroke does not appear to have a significant past medical history other than hypertension and dementia. No sharp debridement is good to be required today which is excellent news. With that being said I do believe that compression is good to be of utmost importance as far as getting these wounds to heal as well as keeping new areas from occurring. 07/20/2019 on evaluation today patient presents for follow-up concerning ongoing issues with her bilateral lower extremities. Fortunately there is no signs of active infection  at this time. With that being said she still has several open wounds that are draining quite significantly at this point. There is no signs of systemic infection. No fevers, chills, nausea, vomiting, or diarrhea. The patient does have significant dementia and has very hard time getting around here in the clinic today I did advise her niece who is with her at this point that in the future she is going to need to use a wheelchair when here at the clinic in order to prevent any additional issues such as injury while she is here on the premises. I understand I do want her to try to walk but I think she needs to do so with a walker and in her more comfortable home environment where again she is more familiar with the surroundings. Patient's family member voiced understanding. 07/30/2019 upon evaluation today patient appears to be doing very well with regard to her lower extremity ulcers. She has been tolerating the dressing changes without complication. I do feel like overall she is making significant improvement which is great news. There does not appear to be any signs of active infection also great news. Overall I am very pleased with the fact that her wounds are measuring much smaller and there is a lot of new epithelization noted at this point. 11/4; this is a very frail woman that have not seen previously. She probably has venous ulcerations with secondary lymphedema. She does not have an open area on the right leg she does have a small area on the left anterior tibia and a superficial larger area on the left lateral calf. Her wounds appear to have been making good progress. 08/30/2019 on evaluation today patient actually appears to be showing signs of excellent progress with regard to her lower extremity ulcer. She does have a small wound just distal to the original wound on the left lower extremity which actually appears to me to be likely a wrap injury where her wrap slid  down a little bit and  bunched up and this probably rubbed wrong on this area. Nonetheless it seems to be healing already and in fact is doing quite well. I think it will close up fairly quickly. 09/09/2019 on evaluation today patient appears to be doing well with regard to the left lower extremity ulcers. The anterior appears healed the medial is all but healed at this time. Fortunately there is no signs of active infection at this time. No fevers, chills, nausea, vomiting, or diarrhea. Patient History Information obtained from Patient. TY, OSHIMA ([id number]) Family History Cancer - Siblings, Diabetes - Siblings, Heart Disease - Siblings,Father, Hypertension - Father,Siblings, No family history of Hereditary Spherocytosis, Kidney Disease, Lung Disease, Seizures, Stroke, Thyroid Problems, Tuberculosis. Social History Never smoker, Marital Status - Widowed, Alcohol Use - Never, Drug Use - No History, Caffeine Use - Rarely. Medical History Cardiovascular Patient has history of Hypertension, Myocardial Infarction - 5 years ago Denies history of Angina, Arrhythmia, Congestive Heart Failure, Coronary Artery Disease, Deep Vein Thrombosis, Hypotension, Peripheral Arterial Disease, Peripheral Venous Disease, Phlebitis, Vasculitis Genitourinary Denies history of End Stage Renal Disease Integumentary (Skin) Denies history of History of Burn, History of pressure wounds Musculoskeletal Patient has history of Osteoarthritis Denies history of Gout, Rheumatoid Arthritis, Osteomyelitis Neurologic Patient has history of Dementia Denies history of Neuropathy, Quadriplegia, Paraplegia, Seizure Disorder Medical And Surgical History Notes Neurologic CVA with anoxic brain injury Review of Systems (ROS) Constitutional Symptoms (General Health) Denies complaints or symptoms of Fatigue, Fever, Chills, Marked Weight Change. Respiratory Denies complaints or symptoms of Chronic or frequent coughs, Shortness of  Breath. Cardiovascular Complains or has symptoms of LE edema. Denies complaints or symptoms of Chest pain. Psychiatric Denies complaints or symptoms of Anxiety, Claustrophobia. Objective Constitutional Well-nourished and well-hydrated in no acute distress. Vitals Time Taken: 12:55 PM, Height: 68 in, Weight: 180 lbs, BMI: 27.4, Temperature: 98.2 F, Pulse: 110 bpm, Respiratory Rate: 16 breaths/min, Blood Pressure: 100/62 mmHg. Respiratory normal breathing without difficulty. clear to auscultation bilaterally. GELENE, RECKTENWALD ([id number]) Cardiovascular regular rate and rhythm with normal S1, S2. Psychiatric Patient is not able to cooperate in decision making regarding care. Patient has dementia. pleasant and cooperative. General Notes: Patient's wounds currently again are almost completely closed and in fact on the anterior portion are. She has excellent epithelization on the medial portion and though not completely close this is almost close that I think will be by next week. 1 more week of wrapping will also give this a chance to toughen up a little bit more. Integumentary (Hair, Skin) Wound #1 status is Open. Original cause of wound was Blister. The wound is located on the Left,Medial Lower Leg. The wound measures 0.1cm length x 0.1cm width x 0.1cm depth; 0.008cm^2 area and 0.001cm^3 volume. The wound is limited to skin breakdown. There is no tunneling or undermining noted. There is a none present amount of drainage noted. The wound margin is flat and intact. There is no granulation within the wound bed. There is no necrotic tissue within the wound bed. Wound #4 status is Open. Original cause of wound was Gradually Appeared. The wound is located on the Left,Anterior Malleolus. The wound measures 0cm length x 0cm width x 0cm depth; 0cm^2 area and 0cm^3 volume. The wound is limited to skin breakdown. There is no tunneling or undermining noted. There is a none present amount of drainage  noted. The wound margin is flat and intact. There is no granulation within the wound bed. There is no necrotic tissue  within the wound bed. Assessment Active Problems ICD-10 Lymphedema, not elsewhere classified Venous insufficiency (chronic) (peripheral) Non-pressure chronic ulcer of other part of left lower leg with fat layer exposed Non-pressure chronic ulcer of other part of right lower leg with fat layer exposed Vascular dementia without behavioral disturbance Essential (primary) hypertension Procedures Wound #1 Pre-procedure diagnosis of Wound #1 is a Venous Leg Ulcer located on the Left,Medial Lower Leg . There was a Three Layer Compression Therapy Procedure with a pre-treatment ABI of 1 by Rodell Perna, RN. Post procedure Diagnosis Wound #1: Same as Pre-Procedure Plan ROZANN, HOLTS ([id number]) Wound Cleansing: Wound #1 Left,Medial Lower Leg: May shower with protection. - Please do not get your wraps wet No tub bath. Anesthetic (add to Medication List): Wound #1 Left,Medial Lower Leg: Topical Lidocaine 4% cream applied to wound bed prior to debridement (In Clinic Only). Primary Wound Dressing: Wound #1 Left,Medial Lower Leg: Silver Collagen - do not wet Secondary Dressing: Wound #1 Left,Medial Lower Leg: ABD pad Dressing Change Frequency: Wound #1 Left,Medial Lower Leg: Change Dressing Monday, Wednesday, Friday - Home health to change dressing Monday, Wednesday and Friday unless patient has a wound care visit on that particular day. Follow-up Appointments: Wound #1 Left,Medial Lower Leg: Return Appointment in 1 week. Edema Control: Wound #1 Left,Medial Lower Leg: 3 Layer Compression System - Left Lower Extremity - Unna to anchor Elevate legs to the level of the heart and pump ankles as often as possible Home Health: Wound #1 Left,Medial Lower Leg: Continue Home Health Visits Home Health Nurse may visit PRN to address patient s wound care needs. FACE TO FACE  ENCOUNTER: MEDICARE and MEDICAID PATIENTS: I certify that this patient is under my care and that I had a face-to-face encounter that meets the physician face-to-face encounter requirements with this patient on this date. The encounter with the patient was in whole or in part for the following MEDICAL CONDITION: (primary reason for Home Healthcare) MEDICAL NECESSITY: I certify, that based on my findings, NURSING services are a medically necessary home health service. HOME BOUND STATUS: I certify that my clinical findings support that this patient is homebound (i.e., Due to illness or injury, pt requires aid of supportive devices such as crutches, cane, wheelchairs, walkers, the use of special transportation or the assistance of another person to leave their place of residence. There is a normal inability to leave the home and doing so requires considerable and taxing effort. Other absences are for medical reasons / religious services and are infrequent or of short duration when for other reasons). If current dressing causes regression in wound condition, may D/C ordered dressing product/s and apply Normal Saline Moist Dressing daily until next Wound Healing Center / Other MD appointment. Notify Wound Healing Center of regression in wound condition at [phone]. Please direct any NON-WOUND related issues/requests for orders to patient's Primary Care Physician 1. My suggestion currently is good to me that we go ahead and continue with the compression wrap for the left lower extremity at this time. We will also continue with the silver collagen which seems to be doing quite well for her. Again I do not see any problems with having discontinued at this time. 2. I am also going to recommend that we go ahead and continue with the 3 layer compression wrap which seems to be doing excellent at this time. 3. The patient is almost completely healed it is just the medial wound that still has a very small  opening hopefully this will  be closed by next week. We will see patient back for reevaluation in 1 week here in the clinic. If anything worsens or changes patient will contact our office for additional recommendations. Electronic Signature(s) MARLA, POULIOT ([id number]) Signed: 09/09/2019 1:16:23 PM By: Worthy Keeler PA-C Entered By: Worthy Keeler on 09/09/2019 13:16:23 April Powers ([id number]) -------------------------------------------------------------------------------- ROS/PFSH Details Patient Name: April Powers Date of Service: 09/09/2019 12:30 PM Medical Record Number: [id number] Patient Account Number: [id number] Date of Birth/Sex: 03/08/1928 (85 y.o. F) Treating RN: Army Melia Primary Care Provider: Evern Bio Other Clinician: Referring Provider: Evern Bio Treating Provider/Extender: STONE III, Takeyah Wieman Weeks in Treatment: 8 Information Obtained From Patient Constitutional Symptoms (General Health) Complaints and Symptoms: Negative for: Fatigue; Fever; Chills; Marked Weight Change Respiratory Complaints and Symptoms: Negative for: Chronic or frequent coughs; Shortness of Breath Cardiovascular Complaints and Symptoms: Positive for: LE edema Negative for: Chest pain Medical History: Positive for: Hypertension; Myocardial Infarction - 5 years ago Negative for: Angina; Arrhythmia; Congestive Heart Failure; Coronary Artery Disease; Deep Vein Thrombosis; Hypotension; Peripheral Arterial Disease; Peripheral Venous Disease; Phlebitis; Vasculitis Psychiatric Complaints and Symptoms: Negative for: Anxiety; Claustrophobia Genitourinary Medical History: Negative for: End Stage Renal Disease Integumentary (Skin) Medical History: Negative for: History of Burn; History of pressure wounds Musculoskeletal Medical History: Positive for: Osteoarthritis Negative for: Gout; Rheumatoid Arthritis; Osteomyelitis Neurologic Medical History: Positive for:  Dementia Negative for: Neuropathy; Quadriplegia; Paraplegia; Seizure Disorder Past Medical History Notes: JENNESS, STEMLER ([id number]) CVA with anoxic brain injury Immunizations Pneumococcal Vaccine: Received Pneumococcal Vaccination: No Implantable Devices None Family and Social History Cancer: Yes - Siblings; Diabetes: Yes - Siblings; Heart Disease: Yes - Siblings,Father; Hereditary Spherocytosis: No; Hypertension: Yes - Father,Siblings; Kidney Disease: No; Lung Disease: No; Seizures: No; Stroke: No; Thyroid Problems: No; Tuberculosis: No; Never smoker; Marital Status - Widowed; Alcohol Use: Never; Drug Use: No History; Caffeine Use: Rarely; Financial Concerns: No; Food, Clothing or Shelter Needs: No; Support System Lacking: No; Transportation Concerns: No Physician Affirmation I have reviewed and agree with the above information. Electronic Signature(s) Signed: 09/09/2019 4:43:12 PM By: Army Melia Signed: 09/09/2019 5:16:54 PM By: Worthy Keeler PA-C Entered By: Worthy Keeler on 09/09/2019 13:15:08 April Powers ([id number]) -------------------------------------------------------------------------------- SuperBill Details Patient Name: April Powers Date of Service: 09/09/2019 Medical Record Number: [id number] Patient Account Number: [id number] Date of Birth/Sex: 1928/03/25 (85 y.o. F) Treating RN: Army Melia Primary Care Provider: Evern Bio Other Clinician: Referring Provider: Evern Bio Treating Provider/Extender: Melburn Hake, Didi Ganaway Weeks in Treatment: 8 Diagnosis Coding ICD-10 Codes Code Description I89.0 Lymphedema, not elsewhere classified I87.2 Venous insufficiency (chronic) (peripheral) L97.822 Non-pressure chronic ulcer of other part of left lower leg with fat layer exposed L97.812 Non-pressure chronic ulcer of other part of right lower leg with fat layer exposed F01.50 Vascular dementia without behavioral disturbance I10 Essential (primary)  hypertension Facility Procedures CPT4 Code: 44920100 Description: (Facility Use Only) 29581LT - APPLY MULTLAY COMPRS LWR LT LEG Modifier: Quantity: 1 Physician Procedures CPT4 Code Description: 7121975 88325 - WC PHYS LEVEL 4 - EST PT ICD-10 Diagnosis Description I89.0 Lymphedema, not elsewhere classified I87.2 Venous insufficiency (chronic) (peripheral) L97.822 Non-pressure chronic ulcer of other part of left lower leg  wit L97.812 Non-pressure chronic ulcer of other part of right lower leg wi Modifier: h fat layer expose th fat layer expos Quantity: 1 d ed Electronic Signature(s) Signed: 09/09/2019 1:16:34 PM By: Worthy Keeler PA-C Entered By: Worthy Keeler on 09/09/2019 13:16:34

## 2019-09-15 ENCOUNTER — Ambulatory Visit
Admission: RE | Admit: 2019-09-15 | Discharge: 2019-09-15 | Disposition: A | Discharge status: Home / Self Care | Payer: Medicare HMO | Source: Ambulatory Visit | Attending: Nurse Practitioner | Admitting: Nurse Practitioner

## 2019-09-15 ENCOUNTER — Other Ambulatory Visit: Payer: Self-pay

## 2019-09-15 DIAGNOSIS — M79605 Pain in left leg: Secondary | ICD-10-CM | POA: Diagnosis present

## 2019-09-15 DIAGNOSIS — M79604 Pain in right leg: Secondary | ICD-10-CM | POA: Insufficient documentation

## 2019-09-16 ENCOUNTER — Encounter: Payer: Medicare HMO | Admitting: Physician Assistant

## 2019-09-16 DIAGNOSIS — L97812 Non-pressure chronic ulcer of other part of right lower leg with fat layer exposed: Secondary | ICD-10-CM | POA: Diagnosis not present

## 2019-09-16 NOTE — Progress Notes (Signed)
April Powers ([id number]) Visit Report for 09/16/2019 Arrival Information Details Patient Name: April Powers, April Powers Date of Service: 09/16/2019 2:00 PM Medical Record Number: [id number] Patient Account Number: [id number] Date of Birth/Sex: October 19, 1927 (85 y.o. F) Treating RN: Army Melia Primary Care Zakkary Thibault: Evern Bio Other Clinician: Referring Aracelis Ulrey: Evern Bio Treating Lucion Dilger/Extender: Melburn Hake, HOYT Weeks in Treatment: 9 Visit Information History Since Last Visit Added or deleted any medications: No Patient Arrived: Wheel Chair Any new allergies or adverse reactions: No Arrival Time: 14:25 Had a fall or experienced change in No Accompanied By: niece activities of daily living that may affect Transfer Assistance: Manual risk of falls: Patient Identification Verified: Yes Signs or symptoms of abuse/neglect since No Secondary Verification Process Yes last visito Completed: Hospitalized since last visit: No Patient Has Alerts: Yes Implantable device outside of the clinic No Patient Alerts: Patient on Blood excluding Thinner cellular tissue based products placed in the [facility] since last visit: Has Dressing in Place as Prescribed: Yes Pain Present Now: Unable to Respond Electronic Signature(s) Signed: 09/16/2019 3:22:29 PM By: Lorine Bears RCP, RRT, CHT Entered By: Lorine Bears on 09/16/2019 14:26:05 April Powers ([id number]) -------------------------------------------------------------------------------- Clinic Level of Care Assessment Details Patient Name: April Powers Date of Service: 09/16/2019 2:00 PM Medical Record Number: [id number] Patient Account Number: [id number] Date of Birth/Sex: 1928/07/28 (85 y.o. F) Treating RN: Army Melia Primary Care Stephine Langbehn: Evern Bio Other Clinician: Referring Saloni Lablanc: Evern Bio Treating Kaleb Linquist/Extender: Melburn Hake, HOYT Weeks in Treatment: 9 Clinic Level  of Care Assessment Items TOOL 4 Quantity Score []  - Use when only an EandM is performed on FOLLOW-UP visit 0 ASSESSMENTS - Nursing Assessment / Reassessment X - Reassessment of Co-morbidities (includes updates in patient status) 1 10 X- 1 5 Reassessment of Adherence to Treatment Plan ASSESSMENTS - Wound and Skin Assessment / Reassessment X - Simple Wound Assessment / Reassessment - one wound 1 5 []  - 0 Complex Wound Assessment / Reassessment - multiple wounds []  - 0 Dermatologic / Skin Assessment (not related to wound area) ASSESSMENTS - Focused Assessment []  - Circumferential Edema Measurements - multi extremities 0 []  - 0 Nutritional Assessment / Counseling / Intervention X- 1 5 Lower Extremity Assessment (monofilament, tuning fork, pulses) []  - 0 Peripheral Arterial Disease Assessment (using hand held doppler) ASSESSMENTS - Ostomy and/or Continence Assessment and Care []  - Incontinence Assessment and Management 0 []  - 0 Ostomy Care Assessment and Management (repouching, etc.) PROCESS - Coordination of Care X - Simple Patient / Family Education for ongoing care 1 15 []  - 0 Complex (extensive) Patient / Family Education for ongoing care X- 1 10 Staff obtains Programmer, systems, Records, Test Results / Process Orders []  - 0 Staff telephones HHA, Nursing Homes / Clarify orders / etc []  - 0 Routine Transfer to another Facility (non-emergent condition) []  - 0 Routine Hospital Admission (non-emergent condition) []  - 0 New Admissions / Biomedical engineer / Ordering NPWT, Apligraf, etc. []  - 0 Emergency Hospital Admission (emergent condition) X- 1 10 Simple Discharge Coordination ETTEL, ALBERGO ([id number]) []  - 0 Complex (extensive) Discharge Coordination PROCESS - Special Needs []  - Pediatric / Minor Patient Management 0 []  - 0 Isolation Patient Management []  - 0 Hearing / Language / Visual special needs []  - 0 Assessment of Community assistance (transportation, D/C  planning, etc.) []  - 0 Additional assistance / Altered mentation []  - 0 Support Surface(s) Assessment (bed, cushion, seat, etc.) INTERVENTIONS - Wound Cleansing / Measurement X - Simple Wound Cleansing - one wound 1 5 []  -  0 Complex Wound Cleansing - multiple wounds X- 1 5 Wound Imaging (photographs - any number of wounds) []  - 0 Wound Tracing (instead of photographs) X- 1 5 Simple Wound Measurement - one wound []  - 0 Complex Wound Measurement - multiple wounds INTERVENTIONS - Wound Dressings []  - Small Wound Dressing one or multiple wounds 0 []  - 0 Medium Wound Dressing one or multiple wounds []  - 0 Large Wound Dressing one or multiple wounds []  - 0 Application of Medications - topical []  - 0 Application of Medications - injection INTERVENTIONS - Miscellaneous []  - External ear exam 0 []  - 0 Specimen Collection (cultures, biopsies, blood, body fluids, etc.) []  - 0 Specimen(s) / Culture(s) sent or taken to Lab for analysis []  - 0 Patient Transfer (multiple staff / Nurse, adultHoyer Lift / Similar devices) []  - 0 Simple Staple / Suture removal (25 or less) []  - 0 Complex Staple / Suture removal (26 or more) []  - 0 Hypo / Hyperglycemic Management (close monitor of Blood Glucose) []  - 0 Ankle / Brachial Index (ABI) - do not check if billed separately X- 1 5 Vital Signs April Powers, Sarely ([id number]) Has the patient been seen at the hospital within the last three years: Yes Total Score: 80 Level Of Care: New/Established - Level 3 Electronic Signature(s) Signed: 09/16/2019 4:29:54 PM By: Rodell PernaScott, Dajea Entered By: Rodell PernaScott, Dajea on 09/16/2019 15:04:27 April Powers, April ([id number]) -------------------------------------------------------------------------------- Encounter Discharge Information Details Patient Name: April Powers, Miyo Date of Service: 09/16/2019 2:00 PM Medical Record Number: [id number] Patient Account Number: [id number] Date of Birth/Sex: [birth year] (85 y.o. F) Treating RN:  Rodell PernaScott, Dajea Primary Care Donya Hitch: Orson EvaBOSWELL, CHELSA Other Clinician: Referring Aren Pryde: Orson EvaBOSWELL, CHELSA Treating Randi Poullard/Extender: Linwood DibblesSTONE III, HOYT Weeks in Treatment: 9 Encounter Discharge Information Items Discharge Condition: Stable Ambulatory Status: Wheelchair Discharge Destination: Home Transportation: Private Auto Accompanied By: caregiver Schedule Follow-up Appointment: Yes Clinical Summary of Care: Electronic Signature(s) Signed: 09/16/2019 4:29:54 PM By: Rodell PernaScott, Dajea Entered By: Rodell PernaScott, Dajea on 09/16/2019 15:05:02 April Powers, Ayane ([id number]) -------------------------------------------------------------------------------- Lower Extremity Assessment Details Patient Name: April Powers, Janella Date of Service: 09/16/2019 2:00 PM Medical Record Number: [id number] Patient Account Number: [id number] Date of Birth/Sex: [birth year] (85 y.o. F) Treating RN: Curtis Sitesorthy, Joanna Primary Care Leondre Taul: Orson EvaBOSWELL, CHELSA Other Clinician: Referring Taevon Aschoff: Orson EvaBOSWELL, CHELSA Treating Viraaj Vorndran/Extender: STONE III, HOYT Weeks in Treatment: 9 Edema Assessment Assessed: [Left: No] [Right: No] Edema: [Left: Ye] [Right: s] Calf Left: Right: Point of Measurement: 34 cm From Medial Instep 35 cm cm Ankle Left: Right: Point of Measurement: 10 cm From Medial Instep 21.5 cm cm Vascular Assessment Pulses: Dorsalis Pedis Palpable: [Left:Yes] Electronic Signature(s) Signed: 09/16/2019 3:25:11 PM By: Curtis Sitesorthy, Joanna Entered By: Curtis Sitesorthy, Joanna on 09/16/2019 14:31:15 April Powers, Adalaide ([id number]) -------------------------------------------------------------------------------- Multi Wound Chart Details Patient Name: April Powers, Francis Date of Service: 09/16/2019 2:00 PM Medical Record Number: [id number] Patient Account Number: [id number] Date of Birth/Sex: [birth year] (85 y.o. F) Treating RN: Rodell PernaScott, Dajea Primary Care Peace Jost: Orson EvaBOSWELL, CHELSA Other Clinician: Referring Virga Haltiwanger: Orson EvaBOSWELL, CHELSA Treating  Marna Weniger/Extender: STONE III, HOYT Weeks in Treatment: 9 Vital Signs Height(in): 68 Pulse(bpm): 139 Weight(lbs): 180 Blood Pressure(mmHg): 125/86 Body Mass Index(BMI): 27 Temperature(F): 98.3 Respiratory Rate 16 (breaths/min): Photos: [N/A:N/A] Wound Location: Left Lower Leg - Medial N/A N/A Wounding Event: Blister N/A N/A Primary Etiology: Venous Leg Ulcer N/A N/A Comorbid History: Hypertension, Myocardial N/A N/A Infarction, Osteoarthritis, Dementia Date Acquired: 07/02/2019 N/A N/A Weeks of Treatment: 9 N/A N/A Wound Status: Open N/A N/A Measurements L x W x D 0x0x0 N/A N/A (cm) Area (cm) : 0 N/A  N/A Volume (cm) : 0 N/A N/A % Reduction in Area: 100.00% N/A N/A % Reduction in Volume: 100.00% N/A N/A Classification: Full Thickness Without N/A N/A Exposed Support Structures Exudate Amount: None Present N/A N/A Wound Margin: Flat and Intact N/A N/A Granulation Amount: None Present (0%) N/A N/A Necrotic Amount: None Present (0%) N/A N/A Exposed Structures: Fascia: No N/A N/A Fat Layer (Subcutaneous Tissue) Exposed: No Tendon: No Muscle: No Joint: No Bone: No Limited to Skin Judyann Casasola, Layali ([id number]) Epithelialization: Large (67-100%) N/A N/A Treatment Notes Electronic Signature(s) Signed: 09/16/2019 4:29:54 PM By: Rodell Perna Entered By: Rodell Perna on 09/16/2019 15:03:25 April Quint ([id number]) -------------------------------------------------------------------------------- Multi-Disciplinary Care Plan Details Patient Name: April Quint Date of Service: 09/16/2019 2:00 PM Medical Record Number: [id number] Patient Account Number: [id number] Date of Birth/Sex: Jul 15, 1928 (85 y.o. F) Treating RN: Rodell Perna Primary Care Zeenat Jeanbaptiste: Orson Eva Other Clinician: Referring Madisyn Mawhinney: Orson Eva Treating Tailynn Armetta/Extender: Linwood Dibbles, HOYT Weeks in Treatment: 9 Active Inactive Electronic Signature(s) Signed: 09/16/2019 4:29:54 PM By:  Rodell Perna Entered By: Rodell Perna on 09/16/2019 15:03:14 April Quint ([id number]) -------------------------------------------------------------------------------- Pain Assessment Details Patient Name: April Quint Date of Service: 09/16/2019 2:00 PM Medical Record Number: [id number] Patient Account Number: [id number] Date of Birth/Sex: [birth year] (85 y.o. F) Treating RN: Rodell Perna Primary Care Zynia Wojtowicz: Orson Eva Other Clinician: Referring Fayola Meckes: Orson Eva Treating Brecklynn Jian/Extender: Linwood Dibbles, HOYT Weeks in Treatment: 9 Active Problems Location of Pain Severity and Description of Pain Patient Has Paino Patient Unable to Respond Site Locations Pain Management and Medication Current Pain Management: Electronic Signature(s) Signed: 09/16/2019 3:22:29 PM By: Sallee Provencal, RRT, CHT Signed: 09/16/2019 4:29:54 PM By: Rodell Perna Entered By: Dayton Martes on 09/16/2019 14:26:18 April Quint ([id number]) -------------------------------------------------------------------------------- Patient/Caregiver Education Details Patient Name: April Quint Date of Service: 09/16/2019 2:00 PM Medical Record Number: [id number] Patient Account Number: [id number] Date of Birth/Gender: 03/30/1928 (85 y.o. F) Treating RN: Rodell Perna Primary Care Physician: Orson Eva Other Clinician: Referring Physician: Orson Eva Treating Physician/Extender: Skeet Simmer in Treatment: 9 Education Assessment Education Provided To: Patient Education Topics Provided Wound/Skin Impairment: Handouts: Caring for Your Ulcer Methods: Demonstration, Explain/Verbal Responses: State content correctly Electronic Signature(s) Signed: 09/16/2019 4:29:54 PM By: Rodell Perna Entered By: Rodell Perna on 09/16/2019 15:04:43 April Quint ([id number]) -------------------------------------------------------------------------------- Wound  Assessment Details Patient Name: April Quint Date of Service: 09/16/2019 2:00 PM Medical Record Number: [id number] Patient Account Number: [id number] Date of Birth/Sex: 1928-07-06 (85 y.o. F) Treating RN: Curtis Sites Primary Care Myron Lona: Orson Eva Other Clinician: Referring Chynah Orihuela: Orson Eva Treating Kert Shackett/Extender: STONE III, HOYT Weeks in Treatment: 9 Wound Status Wound Number: 1 Primary Venous Leg Ulcer Etiology: Wound Location: Left Lower Leg - Medial Wound Status: Open Wounding Event: Blister Comorbid Hypertension, Myocardial Infarction, Date Acquired: 07/02/2019 History: Osteoarthritis, Dementia Weeks Of Treatment: 9 Clustered Wound: No Photos Wound Measurements Length: (cm) 0 % Reduct Width: (cm) 0 % Reduct Depth: (cm) 0 Epitheli Area: (cm) 0 Tunneli Volume: (cm) 0 Undermi ion in Area: 100% ion in Volume: 100% alization: Large (67-100%) ng: No ning: No Wound Description Full Thickness Without Exposed Support Foul Odo Classification: Structures Slough/F Wound Margin: Flat and Intact Exudate None Present Amount: r After Cleansing: No ibrino No Wound Bed Granulation Amount: None Present (0%) Exposed Structure Necrotic Amount: None Present (0%) Fascia Exposed: No Fat Layer (Subcutaneous Tissue) Exposed: No Tendon Exposed: No Muscle Exposed: No Joint Exposed: No Bone Exposed: No Limited to Skin Breakdown Electronic Signature(s) MAKENSIE, MULHALL ([id number]) Signed: 09/16/2019 3:25:11 PM  By: Curtis Sites Entered By: Curtis Sites on 09/16/2019 14:32:21 April Quint ([id number]) -------------------------------------------------------------------------------- Vitals Details Patient Name: April Quint Date of Service: 09/16/2019 2:00 PM Medical Record Number: [id number] Patient Account Number: [id number] Date of Birth/Sex: [birth year] (85 y.o. F) Treating RN: Rodell Perna Primary Care Petrea Fredenburg: Orson Eva Other  Clinician: Referring Javani Spratt: Orson Eva Treating Tyeson Tanimoto/Extender: STONE III, HOYT Weeks in Treatment: 9 Vital Signs Time Taken: 14:25 Temperature (F): 98.3 Height (in): 68 Pulse (bpm): 139 Weight (lbs): 180 Respiratory Rate (breaths/min): 16 Body Mass Index (BMI): 27.4 Blood Pressure (mmHg): 125/86 Reference Range: 80 - 120 mg / dl Electronic Signature(s) Signed: 09/16/2019 3:22:29 PM By: Dayton Martes RCP, RRT, CHT Entered By: Dayton Martes on 09/16/2019 14:27:07

## 2019-09-16 NOTE — Progress Notes (Addendum)
SHATON, LORE ([id number]) Visit Report for 09/16/2019 Chief Complaint Document Details Patient Name: CARINE, NORDGREN Date of Service: 09/16/2019 2:00 PM Medical Record Number: [id number] Patient Account Number: [id number] Date of Birth/Sex: [birth year] (85 y.o. F) Treating RN: Army Melia Primary Care Provider: Evern Bio Other Clinician: Referring Provider: Evern Bio Treating Provider/Extender: Melburn Hake, Pualani Borah Weeks in Treatment: 9 Information Obtained from: Patient Chief Complaint Bilateral LE Ulcers Electronic Signature(s) Signed: 09/16/2019 2:47:23 PM By: Worthy Keeler PA-C Entered By: Worthy Keeler on 09/16/2019 14:47:23 [name] ([id number]) -------------------------------------------------------------------------------- HPI Details Patient Name: [name] Date of Service: 09/16/2019 2:00 PM Medical Record Number: [id number] Patient Account Number: [id number] Date of Birth/Sex: January 15, 1928 (85 y.o. F) Treating RN: Army Melia Primary Care Provider: Evern Bio Other Clinician: Referring Provider: Evern Bio Treating Provider/Extender: Melburn Hake, Jeimy Bickert Weeks in Treatment: 9 History of Present Illness HPI Description: 07/12/2019 on evaluation today patient appears for initial evaluation here in our clinic today concerning an issue that is been happening with her bilateral lower extremities and this began about 10 days ago. She does have bilateral lower extremity edema secondary to venous stasis. This does appear to be stage I lymphedema most likely. With that being said there is no signs of active infection at this time which is good news. That was one concern of her sister was that the patient may have an infection and required antibiotic. The good news is that does not seem to be the case. With that being said I do think the patient needs good edema control at this point. She has previously had compression stockings but is been quite some  time since she has worn them according to her sister who appears to be her primary caregiver and who is with her today. The patient has had a stroke and really does not talk much though fortunately other than the stroke does not appear to have a significant past medical history other than hypertension and dementia. No sharp debridement is good to be required today which is excellent news. With that being said I do believe that compression is good to be of utmost importance as far as getting these wounds to heal as well as keeping new areas from occurring. 07/20/2019 on evaluation today patient presents for follow-up concerning ongoing issues with her bilateral lower extremities. Fortunately there is no signs of active infection at this time. With that being said she still has several open wounds that are draining quite significantly at this point. There is no signs of systemic infection. No fevers, chills, nausea, vomiting, or diarrhea. The patient does have significant dementia and has very hard time getting around here in the clinic today I did advise her niece who is with her at this point that in the future she is going to need to use a wheelchair when here at the clinic in order to prevent any additional issues such as injury while she is here on the premises. I understand I do want her to try to walk but I think she needs to do so with a walker and in her more comfortable home environment where again she is more familiar with the surroundings. Patient's family member voiced understanding. 07/30/2019 upon evaluation today patient appears to be doing very well with regard to her lower extremity ulcers. She has been tolerating the dressing changes without complication. I do feel like overall she is making significant improvement which is great news. There does not appear to be any signs of active infection also great  news. Overall I am very pleased with the fact that her wounds are measuring much  smaller and there is a lot of new epithelization noted at this point. 11/4; this is a very frail woman that have not seen previously. She probably has venous ulcerations with secondary lymphedema. She does not have an open area on the right leg she does have a small area on the left anterior tibia and a superficial larger area on the left lateral calf. Her wounds appear to have been making good progress. 08/30/2019 on evaluation today patient actually appears to be showing signs of excellent progress with regard to her lower extremity ulcer. She does have a small wound just distal to the original wound on the left lower extremity which actually appears to me to be likely a wrap injury where her wrap slid down a little bit and bunched up and this probably rubbed wrong on this area. Nonetheless it seems to be healing already and in fact is doing quite well. I think it will close up fairly quickly. 09/09/2019 on evaluation today patient appears to be doing well with regard to the left lower extremity ulcers. The anterior appears healed the medial is all but healed at this time. Fortunately there is no signs of active infection at this time. No fevers, chills, nausea, vomiting, or diarrhea. 09/16/2019 on evaluation today patient appears to be doing very well with regard to the wounds on her left lower extremity. In fact this appears to be completely healed and she is doing excellent today. Electronic Signature(s) Signed: 09/16/2019 3:12:32 PM By: Lenda Kelp PA-C Entered By: Lenda Kelp on 09/16/2019 15:12:32 Gean Quint ([id number] Quint ([id number]) -------------------------------------------------------------------------------- Physical Exam Details Patient Name: Gean Quint Date of Service: 09/16/2019 2:00 PM Medical Record Number: [id number] Patient Account Number: [id number] Date of Birth/Sex: 17-Feb-1928 (85 y.o. F) Treating RN: Rodell Perna Primary Care Provider:  Orson Eva Other Clinician: Referring Provider: Orson Eva Treating Provider/Extender: STONE III, Shaolin Armas Weeks in Treatment: 9 Constitutional Well-nourished and well-hydrated in no acute distress. Respiratory normal breathing without difficulty. clear to auscultation bilaterally. Cardiovascular regular rate and rhythm with normal S1, S2. Psychiatric this patient is able to make decisions and demonstrates good insight into disease process. Alert and Oriented x 3. pleasant and cooperative. Notes Patient's wound bed currently showed signs of complete epithelization there does not appear to be any evidence of active infection and overall I am extremely pleased with the progress that she has made. I do not see any evidence of cellulitis or worsening I think she would continue to do quite well. Electronic Signature(s) Signed: 09/16/2019 3:13:04 PM By: Lenda Kelp PA-C Entered By: Lenda Kelp on 09/16/2019 15:13:04 Gean Quint ([id number]) -------------------------------------------------------------------------------- Physician Orders Details Patient Name: Gean Quint Date of Service: 09/16/2019 2:00 PM Medical Record Number: [id number] Patient Account Number: [id number] Date of Birth/Sex: 09-03-1928 (85 y.o. F) Treating RN: Rodell Perna Primary Care Provider: Orson Eva Other Clinician: Referring Provider: Orson Eva Treating Provider/Extender: Linwood Dibbles, Kishana Battey Weeks in Treatment: 9 Verbal / Phone Orders: No Diagnosis Coding ICD-10 Coding Code Description I89.0 Lymphedema, not elsewhere classified I87.2 Venous insufficiency (chronic) (peripheral) L97.822 Non-pressure chronic ulcer of other part of left lower leg with fat layer exposed L97.812 Non-pressure chronic ulcer of other part of right lower leg with fat layer exposed F01.50 Vascular dementia without behavioral disturbance I10 Essential (primary) hypertension Discharge From [facility] o  Discharge from Wound Care Center - Treatment complete Electronic Signature(s) Signed: 09/16/2019 4:29:54  PM By: Rodell PernaScott, Dajea Signed: 09/16/2019 6:18:38 PM By: Lenda KelpStone III, Eaton Folmar PA-C Entered By: Rodell PernaScott, Dajea on 09/16/2019 15:03:54 [name], Frimy ([id number]) -------------------------------------------------------------------------------- Problem List Details Patient Name: [name], Symantha Date of Service: 09/16/2019 2:00 PM Medical Record Number: [id number] Patient Account Number: [id number] Date of Birth/Sex: March 22, 1928 (85 y.o. F) Treating RN: Rodell PernaScott, Dajea Primary Care Provider: Orson EvaBOSWELL, CHELSA Other Clinician: Referring Provider: Orson EvaBOSWELL, CHELSA Treating Provider/Extender: Linwood DibblesSTONE III, Korban Shearer Weeks in Treatment: 9 Active Problems ICD-10 Evaluated Encounter Code Description Active Date Today Diagnosis I89.0 Lymphedema, not elsewhere classified 07/12/2019 No Yes I87.2 Venous insufficiency (chronic) (peripheral) 07/12/2019 No Yes L97.822 Non-pressure chronic ulcer of other part of left lower leg with 07/12/2019 No Yes fat layer exposed L97.812 Non-pressure chronic ulcer of other part of right lower leg 07/12/2019 No Yes with fat layer exposed F01.50 Vascular dementia without behavioral disturbance 07/12/2019 No Yes I10 Essential (primary) hypertension 07/12/2019 No Yes Inactive Problems Resolved Problems Electronic Signature(s) Signed: 09/16/2019 2:47:16 PM By: Lenda KelpStone III, Neely Kammerer PA-C Entered By: Lenda KelpStone III, Faizon Capozzi on 09/16/2019 14:47:16 [name], Anayia ([id number]) -------------------------------------------------------------------------------- Progress Note Details Patient Name: [name], Chicquita Date of Service: 09/16/2019 2:00 PM Medical Record Number: [id number] Patient Account Number: [id number] Date of Birth/Sex: March 22, 1928 (85 y.o. F) Treating RN: Rodell PernaScott, Dajea Primary Care Provider: Orson EvaBOSWELL, CHELSA Other Clinician: Referring Provider: Orson EvaBOSWELL, CHELSA Treating Provider/Extender: Linwood DibblesSTONE III,  Francisca Harbuck Weeks in Treatment: 9 Subjective Chief Complaint Information obtained from Patient Bilateral LE Ulcers History of Present Illness (HPI) 07/12/2019 on evaluation today patient appears for initial evaluation here in our clinic today concerning an issue that is been happening with her bilateral lower extremities and this began about 10 days ago. She does have bilateral lower extremity edema secondary to venous stasis. This does appear to be stage I lymphedema most likely. With that being said there is no signs of active infection at this time which is good news. That was one concern of her sister was that the patient may have an infection and required antibiotic. The good news is that does not seem to be the case. With that being said I do think the patient needs good edema control at this point. She has previously had compression stockings but is been quite some time since she has worn them according to her sister who appears to be her primary caregiver and who is with her today. The patient has had a stroke and really does not talk much though fortunately other than the stroke does not appear to have a significant past medical history other than hypertension and dementia. No sharp debridement is good to be required today which is excellent news. With that being said I do believe that compression is good to be of utmost importance as far as getting these wounds to heal as well as keeping new areas from occurring. 07/20/2019 on evaluation today patient presents for follow-up concerning ongoing issues with her bilateral lower extremities. Fortunately there is no signs of active infection at this time. With that being said she still has several open wounds that are draining quite significantly at this point. There is no signs of systemic infection. No fevers, chills, nausea, vomiting, or diarrhea. The patient does have significant dementia and has very hard time getting around here in the clinic  today I did advise her niece who is with her at this point that in the future she is going to need to use a wheelchair when here at the clinic in order to prevent any additional issues such as injury while she  is here on the premises. I understand I do want her to try to walk but I think she needs to do so with a walker and in her more comfortable home environment where again she is more familiar with the surroundings. Patient's family member voiced understanding. 07/30/2019 upon evaluation today patient appears to be doing very well with regard to her lower extremity ulcers. She has been tolerating the dressing changes without complication. I do feel like overall she is making significant improvement which is great news. There does not appear to be any signs of active infection also great news. Overall I am very pleased with the fact that her wounds are measuring much smaller and there is a lot of new epithelization noted at this point. 11/4; this is a very frail woman that have not seen previously. She probably has venous ulcerations with secondary lymphedema. She does not have an open area on the right leg she does have a small area on the left anterior tibia and a superficial larger area on the left lateral calf. Her wounds appear to have been making good progress. 08/30/2019 on evaluation today patient actually appears to be showing signs of excellent progress with regard to her lower extremity ulcer. She does have a small wound just distal to the original wound on the left lower extremity which actually appears to me to be likely a wrap injury where her wrap slid down a little bit and bunched up and this probably rubbed wrong on this area. Nonetheless it seems to be healing already and in fact is doing quite well. I think it will close up fairly quickly. 09/09/2019 on evaluation today patient appears to be doing well with regard to the left lower extremity ulcers. The anterior appears healed  the medial is all but healed at this time. Fortunately there is no signs of active infection at this time. No fevers, chills, nausea, vomiting, or diarrhea. 09/16/2019 on evaluation today patient appears to be doing very well with regard to the wounds on her left lower extremity. In fact this appears to be completely healed and she is doing excellent today. ERNISHA, SORN ([id number]) Patient History Information obtained from Patient. Family History Cancer - Siblings, Diabetes - Siblings, Heart Disease - Siblings,Father, Hypertension - Father,Siblings, No family history of Hereditary Spherocytosis, Kidney Disease, Lung Disease, Seizures, Stroke, Thyroid Problems, Tuberculosis. Social History Never smoker, Marital Status - Widowed, Alcohol Use - Never, Drug Use - No History, Caffeine Use - Rarely. Medical History Cardiovascular Patient has history of Hypertension, Myocardial Infarction - 5 years ago Denies history of Angina, Arrhythmia, Congestive Heart Failure, Coronary Artery Disease, Deep Vein Thrombosis, Hypotension, Peripheral Arterial Disease, Peripheral Venous Disease, Phlebitis, Vasculitis Genitourinary Denies history of End Stage Renal Disease Integumentary (Skin) Denies history of History of Burn, History of pressure wounds Musculoskeletal Patient has history of Osteoarthritis Denies history of Gout, Rheumatoid Arthritis, Osteomyelitis Neurologic Patient has history of Dementia Denies history of Neuropathy, Quadriplegia, Paraplegia, Seizure Disorder Medical And Surgical History Notes Neurologic CVA with anoxic brain injury Review of Systems (ROS) Constitutional Symptoms (General Health) Denies complaints or symptoms of Fatigue, Fever, Chills, Marked Weight Change. Respiratory Denies complaints or symptoms of Chronic or frequent coughs, Shortness of Breath. Cardiovascular Complains or has symptoms of LE edema. Denies complaints or symptoms of Chest  pain. Psychiatric Denies complaints or symptoms of Anxiety, Claustrophobia. Objective Constitutional Well-nourished and well-hydrated in no acute distress. Vitals Time Taken: 2:25 PM, Height: 68 in, Weight: 180 lbs, BMI: 27.4, Temperature: 98.3  F, Pulse: 139 bpm, Respiratory Rate: 16 breaths/min, Blood Pressure: 125/86 mmHg. SUNI, JARNAGIN ([id number]) Respiratory normal breathing without difficulty. clear to auscultation bilaterally. Cardiovascular regular rate and rhythm with normal S1, S2. Psychiatric this patient is able to make decisions and demonstrates good insight into disease process. Alert and Oriented x 3. pleasant and cooperative. General Notes: Patient's wound bed currently showed signs of complete epithelization there does not appear to be any evidence of active infection and overall I am extremely pleased with the progress that she has made. I do not see any evidence of cellulitis or worsening I think she would continue to do quite well. Integumentary (Hair, Skin) Wound #1 status is Open. Original cause of wound was Blister. The wound is located on the Left,Medial Lower Leg. The wound measures 0cm length x 0cm width x 0cm depth; 0cm^2 area and 0cm^3 volume. The wound is limited to skin breakdown. There is no tunneling or undermining noted. There is a none present amount of drainage noted. The wound margin is flat and intact. There is no granulation within the wound bed. There is no necrotic tissue within the wound bed. Assessment Active Problems ICD-10 Lymphedema, not elsewhere classified Venous insufficiency (chronic) (peripheral) Non-pressure chronic ulcer of other part of left lower leg with fat layer exposed Non-pressure chronic ulcer of other part of right lower leg with fat layer exposed Vascular dementia without behavioral disturbance Essential (primary) hypertension Plan Discharge From [facility]: Discharge from Wound Care Center - Treatment complete 1.  My suggestion at this time is good to be that we go ahead and continue with the compression therapy I think that she can use her own compression stockings which she does have with her today we will put those on at discharge. 2. With regard to elevation I think that the patient does need to elevate her legs on a regular basis when she is sitting in order to help with edema control. With that being said hopefully she will continue to do quite well with regard to keeping this area close. We will see the patient back for a follow-up visit as needed. ISOBEL, EISENHUTH ([id number]) Electronic Signature(s) Signed: 09/16/2019 3:13:52 PM By: Lenda Kelp PA-C Entered By: Lenda Kelp on 09/16/2019 15:13:52 Gean Quint ([id number]) -------------------------------------------------------------------------------- ROS/PFSH Details Patient Name: Gean Quint Date of Service: 09/16/2019 2:00 PM Medical Record Number: [id number] Patient Account Number: [id number] Date of Birth/Sex: Dec 05, 1927 (85 y.o. F) Treating RN: Rodell Perna Primary Care Provider: Orson Eva Other Clinician: Referring Provider: Orson Eva Treating Provider/Extender: STONE III, Jameire Kouba Weeks in Treatment: 9 Information Obtained From Patient Constitutional Symptoms (General Health) Complaints and Symptoms: Negative for: Fatigue; Fever; Chills; Marked Weight Change Respiratory Complaints and Symptoms: Negative for: Chronic or frequent coughs; Shortness of Breath Cardiovascular Complaints and Symptoms: Positive for: LE edema Negative for: Chest pain Medical History: Positive for: Hypertension; Myocardial Infarction - 5 years ago Negative for: Angina; Arrhythmia; Congestive Heart Failure; Coronary Artery Disease; Deep Vein Thrombosis; Hypotension; Peripheral Arterial Disease; Peripheral Venous Disease; Phlebitis; Vasculitis Psychiatric Complaints and Symptoms: Negative for: Anxiety;  Claustrophobia Genitourinary Medical History: Negative for: End Stage Renal Disease Integumentary (Skin) Medical History: Negative for: History of Burn; History of pressure wounds Musculoskeletal Medical History: Positive for: Osteoarthritis Negative for: Gout; Rheumatoid Arthritis; Osteomyelitis Neurologic Medical History: Positive for: Dementia Negative for: Neuropathy; Quadriplegia; Paraplegia; Seizure Disorder Past Medical History Notes: BICH, MCHANEY ([id number]) CVA with anoxic brain injury Immunizations Pneumococcal Vaccine: Received Pneumococcal Vaccination: No Implantable Devices None Family and Social History  Cancer: Yes - Siblings; Diabetes: Yes - Siblings; Heart Disease: Yes - Siblings,Father; Hereditary Spherocytosis: No; Hypertension: Yes - Father,Siblings; Kidney Disease: No; Lung Disease: No; Seizures: No; Stroke: No; Thyroid Problems: No; Tuberculosis: No; Never smoker; Marital Status - Widowed; Alcohol Use: Never; Drug Use: No History; Caffeine Use: Rarely; Financial Concerns: No; Food, Clothing or Shelter Needs: No; Support System Lacking: No; Transportation Concerns: No Physician Affirmation I have reviewed and agree with the above information. Electronic Signature(s) Signed: 09/16/2019 4:29:54 PM By: Rodell Perna Signed: 09/16/2019 6:18:38 PM By: Lenda Kelp PA-C Entered By: Lenda Kelp on 09/16/2019 15:12:49 Gean Quint ([id number]) -------------------------------------------------------------------------------- SuperBill Details Patient Name: Gean Quint Date of Service: 09/16/2019 Medical Record Number: [id number] Patient Account Number: [id number] Date of Birth/Sex: 02-Jan-1928 (85 y.o. F) Treating RN: Rodell Perna Primary Care Provider: Orson Eva Other Clinician: Referring Provider: Orson Eva Treating Provider/Extender: Linwood Dibbles, Bartt Gonzaga Weeks in Treatment: 9 Diagnosis Coding ICD-10 Codes Code Description I89.0  Lymphedema, not elsewhere classified I87.2 Venous insufficiency (chronic) (peripheral) L97.822 Non-pressure chronic ulcer of other part of left lower leg with fat layer exposed L97.812 Non-pressure chronic ulcer of other part of right lower leg with fat layer exposed F01.50 Vascular dementia without behavioral disturbance I10 Essential (primary) hypertension Facility Procedures CPT4 Code: [id number] Description: 99213 - WOUND CARE VISIT-LEV 3 EST PT Modifier: Quantity: 1 Physician Procedures CPT4 Code Description: [id number] - WC PHYS LEVEL 3 - EST PT ICD-10 Diagnosis Description I89.0 Lymphedema, not elsewhere classified I87.2 Venous insufficiency (chronic) (peripheral) L97.812 Non-pressure chronic ulcer of other part of right lower leg  wi L97.822 Non-pressure chronic ulcer of other part of left lower leg wit Modifier: th fat layer expo h fat layer expos Quantity: 1 sed ed Electronic Signature(s) Signed: 09/16/2019 3:14:07 PM By: Lenda Kelp PA-C Entered By: Lenda Kelp on 09/16/2019 15:14:06

## 2019-09-23 ENCOUNTER — Ambulatory Visit: Payer: Medicare HMO | Admitting: Podiatry

## 2019-10-08 DEATH — deceased

## 2019-12-15 ENCOUNTER — Ambulatory Visit (INDEPENDENT_AMBULATORY_CARE_PROVIDER_SITE_OTHER): Payer: Medicare HMO | Admitting: Nurse Practitioner

## 2019-12-15 ENCOUNTER — Encounter (INDEPENDENT_AMBULATORY_CARE_PROVIDER_SITE_OTHER): Payer: Medicare HMO

## 2020-09-11 IMAGING — US US EXTREM LOW VENOUS
1 series · 13 of 24 positions shown · non-contrast
Comparison: None.

CLINICAL DATA: Bilateral lower extremity pain and edema for the
past 2 months. History of left calf and foot with bandaging.
Evaluate for DVT.



[Series 1: us extrem low venous · 0.07mm/px · 13 of 74 slices shown]
[im 1/74]
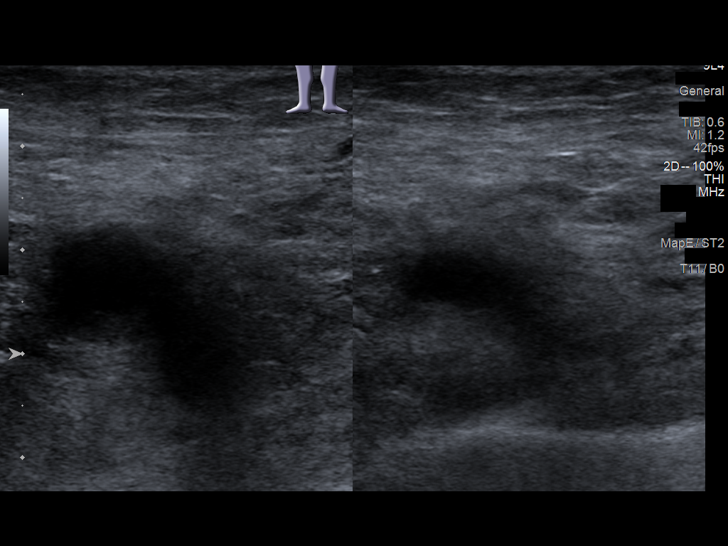
[im 7/74]
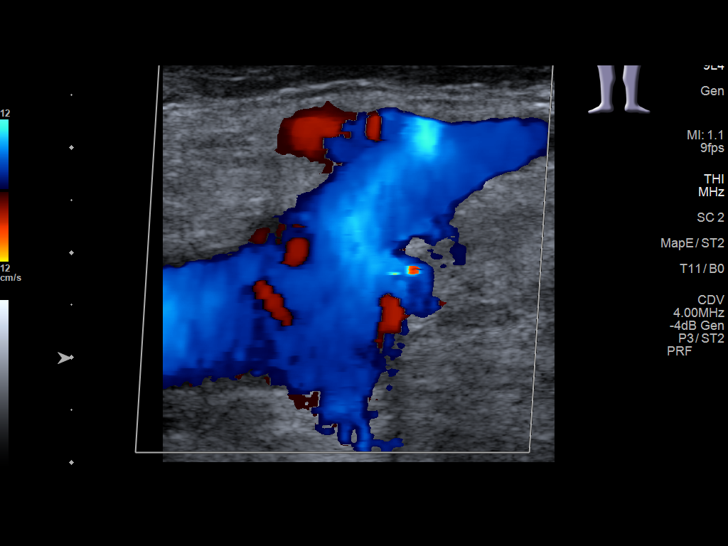
[im 13/74]
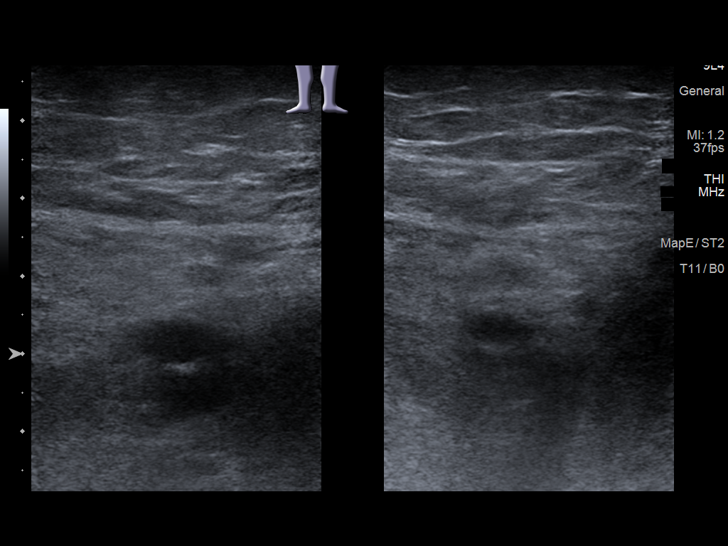
[im 20/74]
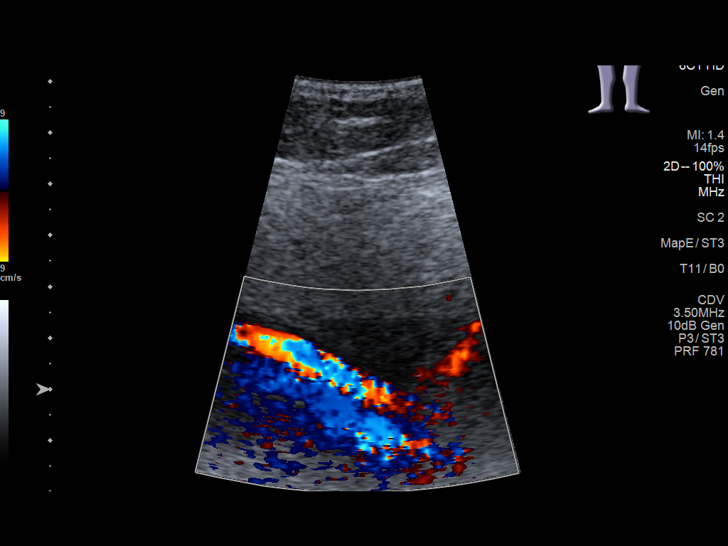
[im 26/74]
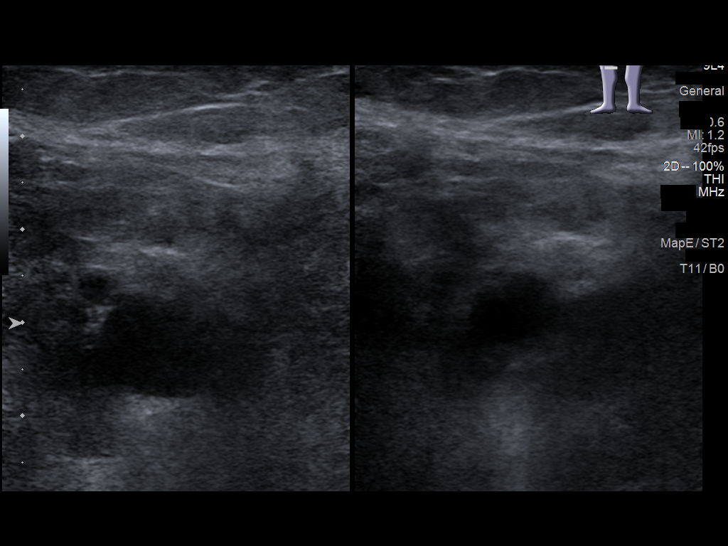
[im 32/74]
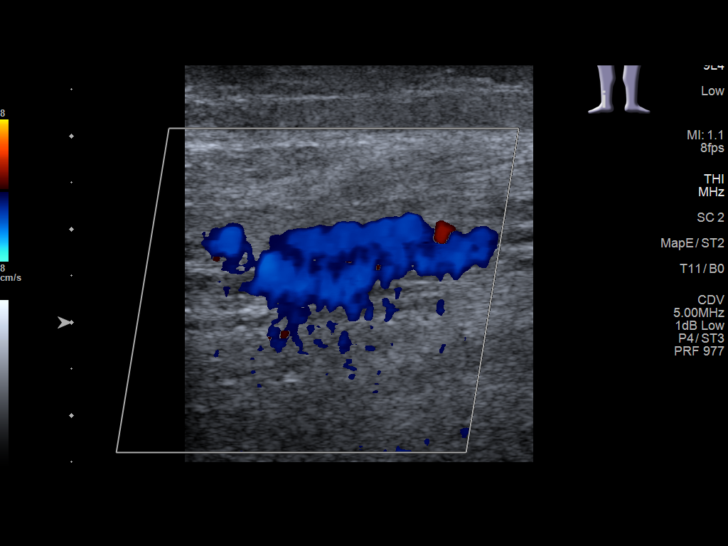
[im 39/74]
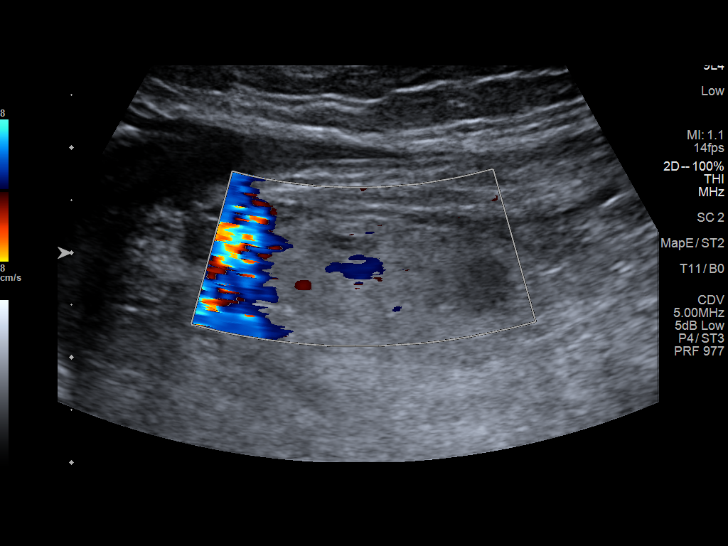
[im 42/74]
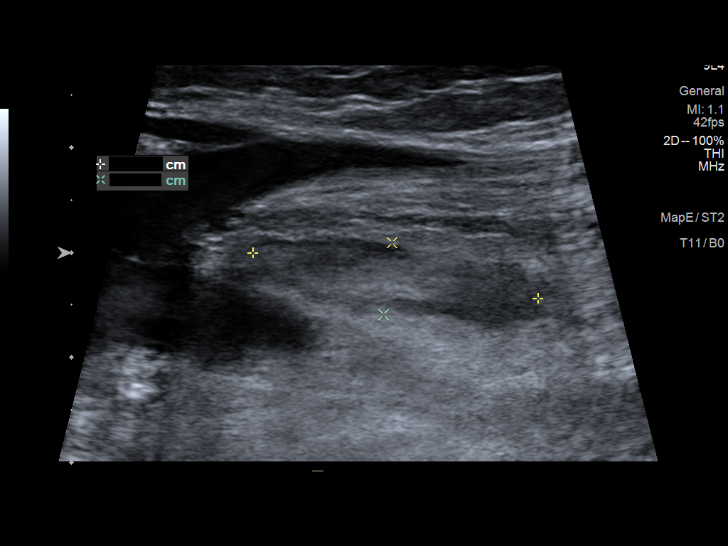
[im 48/74]
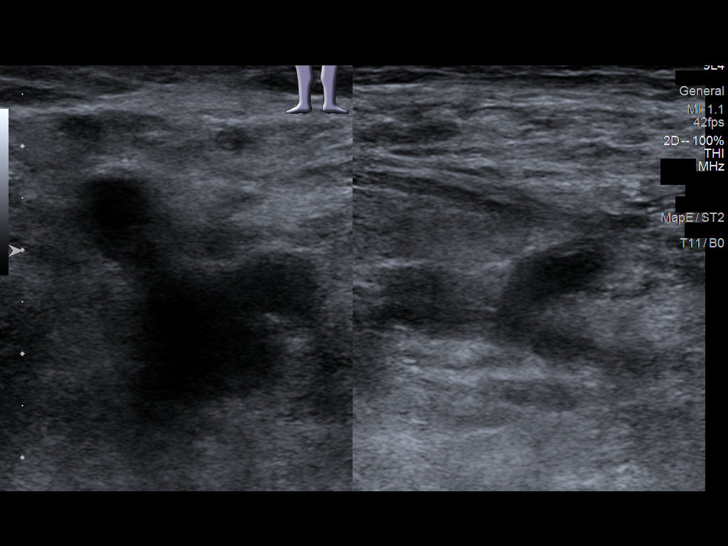
[im 54/74]
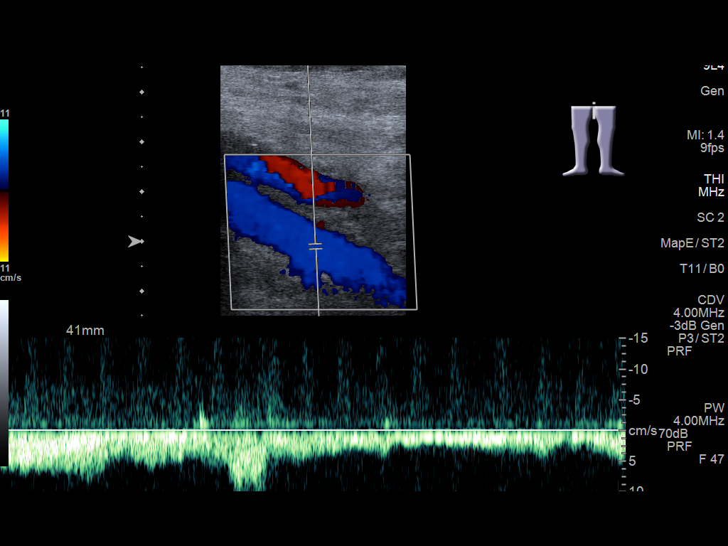
[im 61/74]
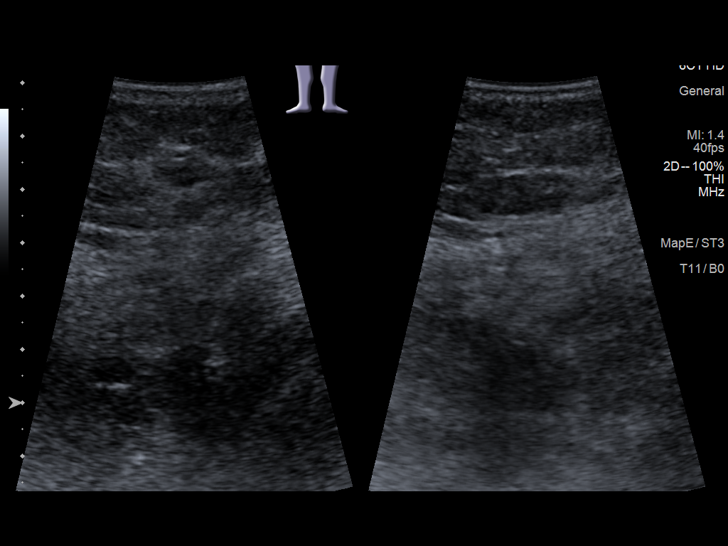
[im 67/74]
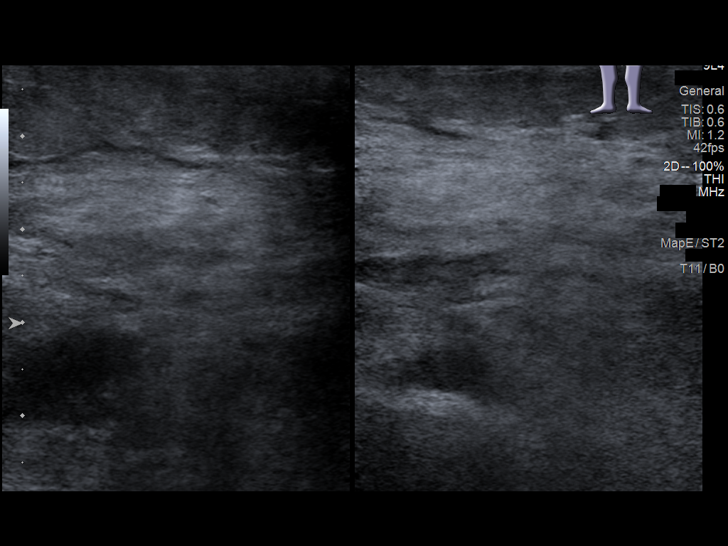
[im 74/74]
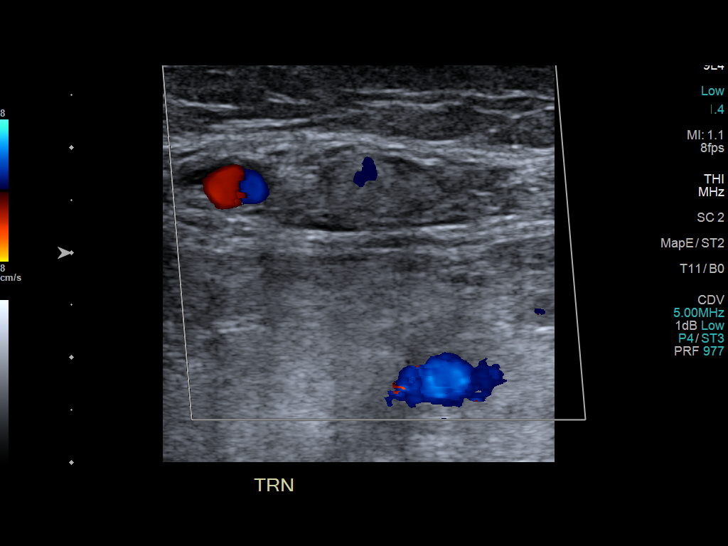

[13 of 24 positions shown; findings below may reference images not displayed]

FINDINGS: RIGHT LOWER EXTREMITY

Common Femoral Vein: No evidence of thrombus. Normal
compressibility, respiratory phasicity and response to augmentation.

Saphenofemoral Junction: No evidence of thrombus. Normal
compressibility and flow on color Doppler imaging.

Profunda Femoral Vein: No evidence of thrombus. Normal
compressibility and flow on color Doppler imaging.

Femoral Vein: No evidence of thrombus. Normal compressibility,
respiratory phasicity and response to augmentation.

Popliteal Vein: No evidence of thrombus. Normal compressibility,
respiratory phasicity and response to augmentation.

Calf Veins: No evidence of thrombus. Normal compressibility and flow
on color Doppler imaging.

Superficial Great Saphenous Vein: No evidence of thrombus. Normal
compressibility.

Other Findings: Note is made of a prominent though non
pathologically enlarged benign appearing right inguinal lymph node
which is not enlarged by size criteria measuring 0.7 cm in greatest
short axis diameter and maintains a benign fatty hilum, likely
reactive in etiology.

LEFT LOWER EXTREMITY

Common Femoral Vein: No evidence of thrombus. Normal
compressibility, respiratory phasicity and response to augmentation.

Saphenofemoral Junction: No evidence of thrombus. Normal
compressibility and flow on color Doppler imaging.

Profunda Femoral Vein: No evidence of thrombus. Normal
compressibility and flow on color Doppler imaging.

Femoral Vein: No evidence of thrombus. Normal compressibility,
respiratory phasicity and response to augmentation.

Popliteal Vein: No evidence of thrombus. Normal compressibility,
respiratory phasicity and response to augmentation.

Calf Veins: Obscured by overlying bandages.

Superficial Great Saphenous Vein: No evidence of thrombus. Normal
compressibility.

Other Findings: Note is made of a prominent though non
pathologically enlarged left inguinal lymph node which is not
enlarged by size criteria measuring 0.6 cm in greatest short axis
diameter and maintains a benign fatty hilum.
IMPRESSION: No evidence of DVT within either lower extremity.

## 2021-06-06 ENCOUNTER — Encounter: Payer: Self-pay | Admitting: Podiatry
